# Patient Record
Sex: Female | Born: 1935 | Race: White | Hispanic: No | Marital: Married
Health system: Southern US, Community
[De-identification: ages and names within clinical notes are randomized; demographics above are authoritative.]

---

## 2000-03-05 ENCOUNTER — Encounter: Admission: RE | Admit: 2000-03-05 | Discharge: 2000-03-05 | Payer: Self-pay | Admitting: Orthopedic Surgery

## 2000-03-05 ENCOUNTER — Ambulatory Visit (HOSPITAL_BASED_OUTPATIENT_CLINIC_OR_DEPARTMENT_OTHER): Admission: RE | Admit: 2000-03-05 | Discharge: 2000-03-05 | Payer: Self-pay | Admitting: Orthopedic Surgery

## 2000-03-05 ENCOUNTER — Encounter: Payer: Self-pay | Admitting: Orthopedic Surgery

## 2004-03-02 ENCOUNTER — Other Ambulatory Visit: Payer: Self-pay

## 2004-11-14 ENCOUNTER — Ambulatory Visit (HOSPITAL_COMMUNITY): Admission: RE | Admit: 2004-11-14 | Discharge: 2004-11-14 | Payer: Self-pay | Admitting: Internal Medicine

## 2005-10-18 ENCOUNTER — Emergency Department: Payer: Self-pay | Admitting: Emergency Medicine

## 2005-10-18 ENCOUNTER — Other Ambulatory Visit: Payer: Self-pay

## 2008-11-13 ENCOUNTER — Ambulatory Visit: Payer: Self-pay | Admitting: Emergency Medicine

## 2008-12-02 ENCOUNTER — Ambulatory Visit: Payer: Self-pay | Admitting: Orthopedic Surgery

## 2009-02-22 ENCOUNTER — Ambulatory Visit: Payer: Self-pay | Admitting: Orthopedic Surgery

## 2009-04-14 ENCOUNTER — Emergency Department: Payer: Self-pay | Admitting: Emergency Medicine

## 2009-09-21 ENCOUNTER — Inpatient Hospital Stay: Payer: Self-pay | Admitting: General Practice

## 2009-10-03 ENCOUNTER — Inpatient Hospital Stay: Payer: Self-pay | Admitting: Specialist

## 2009-10-05 ENCOUNTER — Encounter: Payer: Self-pay | Admitting: Internal Medicine

## 2009-10-11 ENCOUNTER — Encounter: Payer: Self-pay | Admitting: Internal Medicine

## 2010-05-10 ENCOUNTER — Ambulatory Visit: Payer: Self-pay | Admitting: Family Medicine

## 2010-06-07 ENCOUNTER — Ambulatory Visit: Payer: Self-pay | Admitting: Family Medicine

## 2012-03-09 LAB — URINALYSIS, COMPLETE
Glucose,UR: NEGATIVE mg/dL (ref 0–75)
Nitrite: NEGATIVE
Protein: 100
RBC,UR: 277 /HPF (ref 0–5)
Specific Gravity: 1.02 (ref 1.003–1.030)

## 2012-03-09 LAB — CBC
HCT: 32.8 % — ABNORMAL LOW (ref 35.0–47.0)
MCHC: 32.1 g/dL (ref 32.0–36.0)
MCV: 85 fL (ref 80–100)
Platelet: 224 10*3/uL (ref 150–440)
RDW: 16.6 % — ABNORMAL HIGH (ref 11.5–14.5)
WBC: 3.7 10*3/uL (ref 3.6–11.0)

## 2012-03-09 LAB — COMPREHENSIVE METABOLIC PANEL
Albumin: 2.6 g/dL — ABNORMAL LOW (ref 3.4–5.0)
Anion Gap: 7 (ref 7–16)
Bilirubin,Total: 0.3 mg/dL (ref 0.2–1.0)
Co2: 28 mmol/L (ref 21–32)
Creatinine: 1.24 mg/dL (ref 0.60–1.30)
EGFR (African American): 46 — ABNORMAL LOW
EGFR (Non-African Amer.): 40 — ABNORMAL LOW
Potassium: 4.9 mmol/L (ref 3.5–5.1)
Sodium: 137 mmol/L (ref 136–145)
Total Protein: 7.3 g/dL (ref 6.4–8.2)

## 2012-03-09 LAB — TROPONIN I: Troponin-I: <0.02 ng/mL

## 2012-03-10 ENCOUNTER — Ambulatory Visit: Payer: Self-pay | Admitting: Internal Medicine

## 2012-03-10 ENCOUNTER — Inpatient Hospital Stay: Payer: Self-pay | Admitting: Internal Medicine

## 2012-03-10 LAB — IRON AND TIBC
Iron Bind.Cap.(Total): 206 ug/dL — ABNORMAL LOW (ref 250–450)
Iron Saturation: 30 %
Iron: 62 ug/dL (ref 50–170)
Unbound Iron-Bind.Cap.: 144 ug/dL

## 2012-03-10 LAB — FERRITIN: Ferritin (ARMC): 87 ng/mL (ref 8–388)

## 2012-03-11 LAB — BASIC METABOLIC PANEL
Anion Gap: 8 (ref 7–16)
BUN: 19 mg/dL — ABNORMAL HIGH (ref 7–18)
Calcium, Total: 7 mg/dL — CL (ref 8.5–10.1)
Chloride: 107 mmol/L (ref 98–107)
Co2: 23 mmol/L (ref 21–32)
Creatinine: 1.04 mg/dL (ref 0.60–1.30)
EGFR (African American): 57 — ABNORMAL LOW
EGFR (Non-African Amer.): 49 — ABNORMAL LOW
Glucose: 75 mg/dL (ref 65–99)
Osmolality: 277 (ref 275–301)
Potassium: 4.2 mmol/L (ref 3.5–5.1)
Sodium: 138 mmol/L (ref 136–145)

## 2012-03-11 LAB — CBC WITH DIFFERENTIAL/PLATELET
Basophil #: 0 10*3/uL (ref 0.0–0.1)
Basophil %: 0.4 %
Eosinophil #: 0 10*3/uL (ref 0.0–0.7)
Eosinophil %: 0.9 %
HCT: 21.4 % — ABNORMAL LOW (ref 35.0–47.0)
HGB: 7.1 g/dL — ABNORMAL LOW (ref 12.0–16.0)
Lymphocyte #: 1.1 10*3/uL (ref 1.0–3.6)
Lymphocyte %: 47.7 %
MCH: 28.1 pg (ref 26.0–34.0)
MCHC: 33 g/dL (ref 32.0–36.0)
MCV: 85 fL (ref 80–100)
Monocyte #: 0.4 x10 3/mm (ref 0.2–0.9)
Monocyte %: 16.6 %
Neutrophil #: 0.8 10*3/uL — ABNORMAL LOW (ref 1.4–6.5)
Neutrophil %: 34.4 %
Platelet: 163 10*3/uL (ref 150–440)
RBC: 2.52 10*6/uL — ABNORMAL LOW (ref 3.80–5.20)
RDW: 16.7 % — ABNORMAL HIGH (ref 11.5–14.5)
WBC: 2.3 10*3/uL — ABNORMAL LOW (ref 3.6–11.0)

## 2012-03-11 LAB — PROTIME-INR
INR: 1.6
Prothrombin Time: 19.7 secs — ABNORMAL HIGH (ref 11.5–14.7)

## 2012-03-11 LAB — URINE CULTURE

## 2012-03-11 LAB — HEMOGLOBIN A1C: Hemoglobin A1C: 5.3 % (ref 4.2–6.3)

## 2012-03-11 LAB — TSH: Thyroid Stimulating Horm: 1.34 u[IU]/mL

## 2012-03-12 LAB — CBC WITH DIFFERENTIAL/PLATELET
Basophil %: 0.7 %
Eosinophil #: 0 10*3/uL (ref 0.0–0.7)
HCT: 22.2 % — ABNORMAL LOW (ref 35.0–47.0)
HGB: 7.3 g/dL — ABNORMAL LOW (ref 12.0–16.0)
Lymphocyte #: 0.9 10*3/uL — ABNORMAL LOW (ref 1.0–3.6)
Lymphocyte %: 47.1 %
MCH: 27.8 pg (ref 26.0–34.0)
MCHC: 32.6 g/dL (ref 32.0–36.0)
MCV: 85 fL (ref 80–100)
Monocyte #: 0.4 x10 3/mm (ref 0.2–0.9)
Monocyte %: 18.9 %
Neutrophil #: 0.6 10*3/uL — ABNORMAL LOW (ref 1.4–6.5)
Platelet: 161 10*3/uL (ref 150–440)

## 2012-03-12 LAB — PROTIME-INR: Prothrombin Time: 18.9 secs — ABNORMAL HIGH (ref 11.5–14.7)

## 2012-03-12 LAB — FOLATE: Folic Acid: 7.7 ng/mL (ref 3.1–100.0)

## 2012-03-13 LAB — CBC WITH DIFFERENTIAL/PLATELET
Basophil #: 0 10*3/uL (ref 0.0–0.1)
Basophil %: 0.5 %
Basophil %: 0.2 %
Eosinophil #: 0 10*3/uL (ref 0.0–0.7)
Eosinophil #: 0 10*3/uL (ref 0.0–0.7)
Eosinophil %: 2 %
Eosinophil %: 1.1 %
HCT: 23.9 % — ABNORMAL LOW (ref 35.0–47.0)
HCT: 25.2 % — ABNORMAL LOW (ref 35.0–47.0)
HGB: 7.9 g/dL — ABNORMAL LOW (ref 12.0–16.0)
HGB: 8 g/dL — ABNORMAL LOW (ref 12.0–16.0)
Lymphocyte #: 1.1 10*3/uL (ref 1.0–3.6)
Lymphocyte #: 1.2 10*3/uL (ref 1.0–3.6)
Lymphocyte %: 56.8 %
MCH: 28.1 pg (ref 26.0–34.0)
MCHC: 31.8 g/dL — ABNORMAL LOW (ref 32.0–36.0)
MCV: 85 fL (ref 80–100)
MCV: 86 fL (ref 80–100)
Monocyte #: 0.4 x10 3/mm (ref 0.2–0.9)
Monocyte %: 17.3 %
Monocyte %: 16.4 %
Neutrophil #: 0.5 10*3/uL — ABNORMAL LOW (ref 1.4–6.5)
Neutrophil #: 0.6 10*3/uL — ABNORMAL LOW (ref 1.4–6.5)
Platelet: 172 10*3/uL (ref 150–440)
RBC: 2.81 10*6/uL — ABNORMAL LOW (ref 3.80–5.20)
RBC: 2.92 10*6/uL — ABNORMAL LOW (ref 3.80–5.20)
RDW: 16.8 % — ABNORMAL HIGH (ref 11.5–14.5)
WBC: 2 10*3/uL — CL (ref 3.6–11.0)

## 2012-03-13 LAB — PROTIME-INR: INR: 1.6

## 2012-03-13 LAB — LACTATE DEHYDROGENASE: LDH: 144 U/L (ref 84–246)

## 2012-03-14 LAB — CBC WITH DIFFERENTIAL/PLATELET
Basophil #: 0 10*3/uL (ref 0.0–0.1)
Basophil %: 0.6 %
Eosinophil #: 0 10*3/uL (ref 0.0–0.7)
Eosinophil %: 1.8 %
HCT: 24.3 % — ABNORMAL LOW (ref 35.0–47.0)
Lymphocyte %: 58.4 %
MCH: 28 pg (ref 26.0–34.0)
MCHC: 33.2 g/dL (ref 32.0–36.0)
MCV: 84 fL (ref 80–100)
Monocyte #: 0.3 x10 3/mm (ref 0.2–0.9)
Neutrophil %: 24.7 %
Platelet: 178 10*3/uL (ref 150–440)
RBC: 2.88 10*6/uL — ABNORMAL LOW (ref 3.80–5.20)
RDW: 17.2 % — ABNORMAL HIGH (ref 11.5–14.5)

## 2012-03-14 LAB — URINALYSIS, COMPLETE
Bacteria: NONE SEEN
Bilirubin,UR: NEGATIVE
Hyaline Cast: 1
Ketone: NEGATIVE
Nitrite: NEGATIVE
Ph: 5 (ref 4.5–8.0)
RBC,UR: 32 /HPF (ref 0–5)
Squamous Epithelial: 1

## 2012-03-14 LAB — PROTIME-INR
INR: 2.2
Prothrombin Time: 25 secs — ABNORMAL HIGH (ref 11.5–14.7)

## 2012-03-14 LAB — SEDIMENTATION RATE: Erythrocyte Sed Rate: 31 mm/hr — ABNORMAL HIGH (ref 0–30)

## 2012-03-15 LAB — PROTIME-INR
INR: 2.8
Prothrombin Time: 29.8 secs — ABNORMAL HIGH (ref 11.5–14.7)

## 2012-04-08 ENCOUNTER — Ambulatory Visit: Payer: Self-pay | Admitting: General Practice

## 2012-04-08 LAB — URINALYSIS, COMPLETE
Bacteria: NONE SEEN
Bilirubin,UR: NEGATIVE
Glucose,UR: NEGATIVE mg/dL (ref 0–75)
Nitrite: NEGATIVE
RBC,UR: 58 /HPF (ref 0–5)
Squamous Epithelial: 3

## 2012-04-08 LAB — SEDIMENTATION RATE: Erythrocyte Sed Rate: 34 mm/hr — ABNORMAL HIGH (ref 0–30)

## 2012-04-08 LAB — PROTIME-INR
INR: 2.3
Prothrombin Time: 25.1 secs — ABNORMAL HIGH (ref 11.5–14.7)

## 2012-04-08 LAB — BASIC METABOLIC PANEL
Anion Gap: 7 (ref 7–16)
Co2: 30 mmol/L (ref 21–32)
Creatinine: 1.03 mg/dL (ref 0.60–1.30)
EGFR (African American): 58 — ABNORMAL LOW
EGFR (Non-African Amer.): 50 — ABNORMAL LOW
Glucose: 157 mg/dL — ABNORMAL HIGH (ref 65–99)
Osmolality: 287 (ref 275–301)
Sodium: 141 mmol/L (ref 136–145)

## 2012-04-08 LAB — CBC
HCT: 32.5 % — ABNORMAL LOW (ref 35.0–47.0)
MCH: 28.1 pg (ref 26.0–34.0)
MCHC: 33.1 g/dL (ref 32.0–36.0)
RDW: 17.1 % — ABNORMAL HIGH (ref 11.5–14.5)
WBC: 4.9 10*3/uL (ref 3.6–11.0)

## 2012-04-10 ENCOUNTER — Ambulatory Visit: Payer: Self-pay | Admitting: Internal Medicine

## 2012-04-23 ENCOUNTER — Inpatient Hospital Stay: Payer: Self-pay | Admitting: General Practice

## 2012-04-24 LAB — PROTIME-INR
INR: 1.1
Prothrombin Time: 14.2 secs (ref 11.5–14.7)

## 2012-04-24 LAB — BASIC METABOLIC PANEL
BUN: 11 mg/dL (ref 7–18)
Calcium, Total: 7.9 mg/dL — ABNORMAL LOW (ref 8.5–10.1)
Chloride: 104 mmol/L (ref 98–107)
Creatinine: 0.91 mg/dL (ref 0.60–1.30)
EGFR (Non-African Amer.): 58 — ABNORMAL LOW
Glucose: 74 mg/dL (ref 65–99)
Osmolality: 277 (ref 275–301)
Potassium: 4.1 mmol/L (ref 3.5–5.1)
Sodium: 140 mmol/L (ref 136–145)

## 2012-04-24 LAB — PLATELET COUNT: Platelet: 99 10*3/uL — ABNORMAL LOW (ref 150–440)

## 2012-04-24 LAB — HEMOGLOBIN: HGB: 8.5 g/dL — ABNORMAL LOW (ref 12.0–16.0)

## 2012-04-25 LAB — BASIC METABOLIC PANEL
BUN: 13 mg/dL (ref 7–18)
Calcium, Total: 7.7 mg/dL — ABNORMAL LOW (ref 8.5–10.1)
Chloride: 107 mmol/L (ref 98–107)
Creatinine: 0.99 mg/dL (ref 0.60–1.30)
Glucose: 142 mg/dL — ABNORMAL HIGH (ref 65–99)
Osmolality: 282 (ref 275–301)
Potassium: 4 mmol/L (ref 3.5–5.1)
Sodium: 140 mmol/L (ref 136–145)

## 2012-04-25 LAB — PLATELET COUNT: Platelet: 106 10*3/uL — ABNORMAL LOW (ref 150–440)

## 2012-04-26 LAB — PROTIME-INR
INR: 2.1
Prothrombin Time: 24.2 secs — ABNORMAL HIGH (ref 11.5–14.7)

## 2012-04-26 LAB — HEMOGLOBIN: HGB: 8.3 g/dL — ABNORMAL LOW (ref 12.0–16.0)

## 2012-06-01 ENCOUNTER — Inpatient Hospital Stay: Payer: Self-pay | Admitting: Internal Medicine

## 2012-06-01 LAB — CK TOTAL AND CKMB (NOT AT ARMC)
CK, Total: 13 U/L — ABNORMAL LOW (ref 21–215)
CK-MB: <0.5 ng/mL — ABNORMAL LOW (ref 0.5–3.6)

## 2012-06-01 LAB — URINALYSIS, COMPLETE
Bilirubin,UR: NEGATIVE
Leukocyte Esterase: NEGATIVE
Nitrite: NEGATIVE
Ph: 5 (ref 4.5–8.0)
Protein: 30
RBC,UR: 75 /HPF (ref 0–5)
Squamous Epithelial: NONE SEEN
WBC UR: 2 /HPF (ref 0–5)

## 2012-06-01 LAB — DIFFERENTIAL
Basophil #: 0 10*3/uL (ref 0.0–0.1)
Basophil %: 0.4 %
Eosinophil %: 0.8 %
Lymphocyte #: 1 10*3/uL (ref 1.0–3.6)
Monocyte #: 0.5 x10 3/mm (ref 0.2–0.9)
Monocyte %: 22.1 %
Neutrophil #: 0.9 10*3/uL — ABNORMAL LOW (ref 1.4–6.5)

## 2012-06-01 LAB — COMPREHENSIVE METABOLIC PANEL
Albumin: 2.2 g/dL — ABNORMAL LOW (ref 3.4–5.0)
Anion Gap: 9 (ref 7–16)
BUN: 18 mg/dL (ref 7–18)
Bilirubin,Total: 0.6 mg/dL (ref 0.2–1.0)
Calcium, Total: 8.2 mg/dL — ABNORMAL LOW (ref 8.5–10.1)
Co2: 29 mmol/L (ref 21–32)
Creatinine: 0.92 mg/dL (ref 0.60–1.30)
Glucose: 138 mg/dL — ABNORMAL HIGH (ref 65–99)
Osmolality: 274 (ref 275–301)
Potassium: 4.3 mmol/L (ref 3.5–5.1)
Sodium: 135 mmol/L — ABNORMAL LOW (ref 136–145)

## 2012-06-01 LAB — TROPONIN I: Troponin-I: <0.02 ng/mL

## 2012-06-01 LAB — CBC
HCT: 28.8 % — ABNORMAL LOW (ref 35.0–47.0)
MCV: 85 fL (ref 80–100)
WBC: 2.4 10*3/uL — ABNORMAL LOW (ref 3.6–11.0)

## 2012-06-02 LAB — SEDIMENTATION RATE: Erythrocyte Sed Rate: 1 mm/hr (ref 0–30)

## 2012-06-02 LAB — CBC WITH DIFFERENTIAL/PLATELET
Basophil %: 0.6 %
Eosinophil %: 0.7 %
HCT: 26.9 % — ABNORMAL LOW (ref 35.0–47.0)
HGB: 9 g/dL — ABNORMAL LOW (ref 12.0–16.0)
Lymphocyte %: 29.2 %
MCHC: 33.4 g/dL (ref 32.0–36.0)
Monocyte %: 14.4 %
Neutrophil #: 1.5 10*3/uL (ref 1.4–6.5)
Neutrophil %: 55.1 %
RBC: 3.13 10*6/uL — ABNORMAL LOW (ref 3.80–5.20)
WBC: 2.8 10*3/uL — ABNORMAL LOW (ref 3.6–11.0)

## 2012-06-02 LAB — COMPREHENSIVE METABOLIC PANEL
Albumin: 1.9 g/dL — ABNORMAL LOW (ref 3.4–5.0)
Anion Gap: 8 (ref 7–16)
BUN: 12 mg/dL (ref 7–18)
Bilirubin,Total: 0.3 mg/dL (ref 0.2–1.0)
Chloride: 100 mmol/L (ref 98–107)
Co2: 27 mmol/L (ref 21–32)
EGFR (African American): >60
EGFR (Non-African Amer.): >60
Glucose: 142 mg/dL — ABNORMAL HIGH (ref 65–99)
SGOT(AST): 18 U/L (ref 15–37)
SGPT (ALT): 26 U/L (ref 12–78)
Sodium: 135 mmol/L — ABNORMAL LOW (ref 136–145)
Total Protein: 6 g/dL — ABNORMAL LOW (ref 6.4–8.2)

## 2012-06-02 LAB — URINE CULTURE

## 2012-06-02 LAB — PROTIME-INR: INR: 1.7

## 2012-06-06 LAB — CULTURE, BLOOD (SINGLE)

## 2012-06-11 ENCOUNTER — Inpatient Hospital Stay: Payer: Self-pay | Admitting: Internal Medicine

## 2012-06-11 LAB — CBC
HCT: 28.5 % — ABNORMAL LOW (ref 35.0–47.0)
HGB: 9.7 g/dL — ABNORMAL LOW (ref 12.0–16.0)
MCH: 28.6 pg (ref 26.0–34.0)
MCHC: 34 g/dL (ref 32.0–36.0)
MCV: 84 fL (ref 80–100)
Platelet: 176 10*3/uL (ref 150–440)
RBC: 3.39 10*6/uL — ABNORMAL LOW (ref 3.80–5.20)
RDW: 16.4 % — ABNORMAL HIGH (ref 11.5–14.5)

## 2012-06-11 LAB — COMPREHENSIVE METABOLIC PANEL
Alkaline Phosphatase: 91 U/L (ref 50–136)
Anion Gap: 4 — ABNORMAL LOW (ref 7–16)
BUN: 21 mg/dL — ABNORMAL HIGH (ref 7–18)
Bilirubin,Total: 0.3 mg/dL (ref 0.2–1.0)
Chloride: 100 mmol/L (ref 98–107)
Creatinine: 1.12 mg/dL (ref 0.60–1.30)
EGFR (African American): 52 — ABNORMAL LOW
SGPT (ALT): 11 U/L — ABNORMAL LOW (ref 12–78)
Sodium: 133 mmol/L — ABNORMAL LOW (ref 136–145)
Total Protein: 6.1 g/dL — ABNORMAL LOW (ref 6.4–8.2)

## 2012-06-11 LAB — URINALYSIS, COMPLETE
Bilirubin,UR: NEGATIVE
Ketone: NEGATIVE
Ph: 7 (ref 4.5–8.0)
Protein: NEGATIVE
RBC,UR: 21 /HPF (ref 0–5)
Specific Gravity: 1.012 (ref 1.003–1.030)
Squamous Epithelial: NONE SEEN

## 2012-06-11 LAB — PROTIME-INR
INR: 1.7
Prothrombin Time: 20 secs — ABNORMAL HIGH (ref 11.5–14.7)

## 2012-06-12 LAB — CBC WITH DIFFERENTIAL/PLATELET
Basophil #: 0 10*3/uL (ref 0.0–0.1)
Eosinophil #: 0 10*3/uL (ref 0.0–0.7)
Eosinophil %: 0.5 %
HCT: 26.6 % — ABNORMAL LOW (ref 35.0–47.0)
Lymphocyte #: 0.6 10*3/uL — ABNORMAL LOW (ref 1.0–3.6)
Lymphocyte %: 16.2 %
MCH: 28.2 pg (ref 26.0–34.0)
MCV: 84 fL (ref 80–100)
Monocyte %: 16.9 %
Neutrophil #: 2.3 10*3/uL (ref 1.4–6.5)
Platelet: 137 10*3/uL — ABNORMAL LOW (ref 150–440)
RBC: 3.15 10*6/uL — ABNORMAL LOW (ref 3.80–5.20)
RDW: 16.4 % — ABNORMAL HIGH (ref 11.5–14.5)
WBC: 3.5 10*3/uL — ABNORMAL LOW (ref 3.6–11.0)

## 2012-06-12 LAB — MAGNESIUM: Magnesium: 1.9 mg/dL

## 2012-06-12 LAB — BASIC METABOLIC PANEL
BUN: 16 mg/dL (ref 7–18)
Calcium, Total: 7.9 mg/dL — ABNORMAL LOW (ref 8.5–10.1)
Chloride: 102 mmol/L (ref 98–107)
Creatinine: 1.06 mg/dL (ref 0.60–1.30)
EGFR (Non-African Amer.): 48 — ABNORMAL LOW
Glucose: 118 mg/dL — ABNORMAL HIGH (ref 65–99)
Potassium: 5.1 mmol/L (ref 3.5–5.1)

## 2012-06-13 LAB — CBC WITH DIFFERENTIAL/PLATELET
Basophil %: 0.5 %
Eosinophil %: 0.9 %
HCT: 24.4 % — ABNORMAL LOW (ref 35.0–47.0)
HGB: 8.4 g/dL — ABNORMAL LOW (ref 12.0–16.0)
Lymphocyte #: 0.7 10*3/uL — ABNORMAL LOW (ref 1.0–3.6)
MCH: 29 pg (ref 26.0–34.0)
MCV: 84 fL (ref 80–100)
Monocyte #: 0.5 x10 3/mm (ref 0.2–0.9)
Monocyte %: 16.1 %
Platelet: 159 10*3/uL (ref 150–440)
RBC: 2.9 10*6/uL — ABNORMAL LOW (ref 3.80–5.20)
WBC: 3.3 10*3/uL — ABNORMAL LOW (ref 3.6–11.0)

## 2012-06-13 LAB — PROTIME-INR
INR: 1.8
Prothrombin Time: 20.9 secs — ABNORMAL HIGH (ref 11.5–14.7)

## 2012-06-13 LAB — URINE CULTURE

## 2012-06-14 LAB — CBC WITH DIFFERENTIAL/PLATELET
Basophil %: 0.8 %
Eosinophil #: 0.1 10*3/uL (ref 0.0–0.7)
Eosinophil %: 1.4 %
HCT: 23 % — ABNORMAL LOW (ref 35.0–47.0)
HGB: 8.1 g/dL — ABNORMAL LOW (ref 12.0–16.0)
Lymphocyte %: 19.5 %
Monocyte %: 11.4 %
Neutrophil #: 2.5 10*3/uL (ref 1.4–6.5)
Neutrophil %: 66.9 %
RBC: 2.76 10*6/uL — ABNORMAL LOW (ref 3.80–5.20)
WBC: 3.7 10*3/uL (ref 3.6–11.0)

## 2012-06-15 LAB — CREATININE, SERUM
EGFR (African American): >60
EGFR (Non-African Amer.): 58 — ABNORMAL LOW

## 2012-06-15 LAB — VANCOMYCIN, TROUGH: Vancomycin, Trough: 9 ug/mL — ABNORMAL LOW (ref 10–20)

## 2012-06-15 LAB — PROTIME-INR: INR: 2.8

## 2012-06-16 LAB — PROTIME-INR: INR: 3.4

## 2012-06-16 LAB — CULTURE, BLOOD (SINGLE)

## 2012-10-03 ENCOUNTER — Ambulatory Visit: Payer: Self-pay | Admitting: Family Medicine

## 2012-10-03 LAB — PROTIME-INR: Prothrombin Time: 44.7 secs — ABNORMAL HIGH (ref 11.5–14.7)

## 2012-12-02 ENCOUNTER — Ambulatory Visit: Payer: Self-pay | Admitting: General Practice

## 2012-12-02 LAB — BASIC METABOLIC PANEL
Anion Gap: 2 — ABNORMAL LOW (ref 7–16)
BUN: 21 mg/dL — ABNORMAL HIGH (ref 7–18)
Co2: 31 mmol/L (ref 21–32)
Creatinine: 1.2 mg/dL (ref 0.60–1.30)
EGFR (African American): 48 — ABNORMAL LOW
EGFR (Non-African Amer.): 41 — ABNORMAL LOW
Osmolality: 276 (ref 275–301)
Sodium: 135 mmol/L — ABNORMAL LOW (ref 136–145)

## 2012-12-02 LAB — URINALYSIS, COMPLETE
Ketone: NEGATIVE
Nitrite: POSITIVE
Ph: 7 (ref 4.5–8.0)
Protein: 30
RBC,UR: 3 /HPF (ref 0–5)
Specific Gravity: 1.016 (ref 1.003–1.030)
Squamous Epithelial: 2
WBC UR: 122 /HPF (ref 0–5)

## 2012-12-02 LAB — CBC
HGB: 11.2 g/dL — ABNORMAL LOW (ref 12.0–16.0)
MCH: 27.9 pg (ref 26.0–34.0)
MCV: 86 fL (ref 80–100)
Platelet: 162 10*3/uL (ref 150–440)
RDW: 14.6 % — ABNORMAL HIGH (ref 11.5–14.5)
WBC: 4.2 10*3/uL (ref 3.6–11.0)

## 2012-12-02 LAB — APTT: Activated PTT: 30.1 secs (ref 23.6–35.9)

## 2012-12-02 LAB — PROTIME-INR: INR: 1.6

## 2012-12-02 LAB — MRSA PCR SCREENING

## 2012-12-03 LAB — URINE CULTURE

## 2012-12-15 ENCOUNTER — Inpatient Hospital Stay: Payer: Self-pay | Admitting: General Practice

## 2012-12-15 LAB — PROTIME-INR
INR: 1.1
Prothrombin Time: 14.4 secs (ref 11.5–14.7)

## 2012-12-16 LAB — BASIC METABOLIC PANEL
Co2: 29 mmol/L (ref 21–32)
Creatinine: 1.06 mg/dL (ref 0.60–1.30)
EGFR (African American): 55 — ABNORMAL LOW
Glucose: 118 mg/dL — ABNORMAL HIGH (ref 65–99)
Osmolality: 271 (ref 275–301)
Potassium: 4.6 mmol/L (ref 3.5–5.1)
Sodium: 135 mmol/L — ABNORMAL LOW (ref 136–145)

## 2012-12-16 LAB — PROTIME-INR: Prothrombin Time: 14.5 secs (ref 11.5–14.7)

## 2012-12-16 LAB — PLATELET COUNT: Platelet: 129 10*3/uL — ABNORMAL LOW (ref 150–440)

## 2012-12-17 LAB — PLATELET COUNT: Platelet: 125 10*3/uL — ABNORMAL LOW (ref 150–440)

## 2012-12-17 LAB — BASIC METABOLIC PANEL
BUN: 12 mg/dL (ref 7–18)
Calcium, Total: 7.4 mg/dL — ABNORMAL LOW (ref 8.5–10.1)
Chloride: 104 mmol/L (ref 98–107)
Creatinine: 0.99 mg/dL (ref 0.60–1.30)
EGFR (African American): >60
EGFR (Non-African Amer.): 52 — ABNORMAL LOW
Glucose: 117 mg/dL — ABNORMAL HIGH (ref 65–99)
Potassium: 4.3 mmol/L (ref 3.5–5.1)

## 2012-12-17 LAB — PROTIME-INR
INR: 2.5
Prothrombin Time: 26.6 secs — ABNORMAL HIGH (ref 11.5–14.7)

## 2012-12-17 LAB — HEMOGLOBIN: HGB: 7.1 g/dL — ABNORMAL LOW (ref 12.0–16.0)

## 2012-12-18 LAB — PROTIME-INR
INR: 3.3
Prothrombin Time: 32.3 secs — ABNORMAL HIGH (ref 11.5–14.7)

## 2013-01-02 ENCOUNTER — Emergency Department: Payer: Self-pay | Admitting: Emergency Medicine

## 2013-10-25 ENCOUNTER — Emergency Department: Payer: Self-pay | Admitting: Emergency Medicine

## 2013-10-26 LAB — CBC WITH DIFFERENTIAL/PLATELET
BASOS ABS: 0 10*3/uL (ref 0.0–0.1)
Basophil %: 0.8 %
Eosinophil #: 0 10*3/uL (ref 0.0–0.7)
Eosinophil %: 1.5 %
HCT: 33 % — ABNORMAL LOW (ref 35.0–47.0)
HGB: 11.1 g/dL — AB (ref 12.0–16.0)
Lymphocyte #: 1.6 10*3/uL (ref 1.0–3.6)
Lymphocyte %: 67.2 %
MCH: 28.7 pg (ref 26.0–34.0)
MCHC: 33.7 g/dL (ref 32.0–36.0)
MCV: 85 fL (ref 80–100)
Monocyte #: 0.4 x10 3/mm (ref 0.2–0.9)
Monocyte %: 15 %
NEUTROS PCT: 15.5 %
Neutrophil #: 0.4 10*3/uL — ABNORMAL LOW (ref 1.4–6.5)
PLATELETS: 111 10*3/uL — AB (ref 150–440)
RBC: 3.88 10*6/uL (ref 3.80–5.20)
RDW: 15.5 % — ABNORMAL HIGH (ref 11.5–14.5)
WBC: 2.4 10*3/uL — ABNORMAL LOW (ref 3.6–11.0)

## 2013-10-26 LAB — COMPREHENSIVE METABOLIC PANEL
ALK PHOS: 75 U/L
ALT: 8 U/L — AB (ref 12–78)
Albumin: 2.6 g/dL — ABNORMAL LOW (ref 3.4–5.0)
Anion Gap: 3 — ABNORMAL LOW (ref 7–16)
BUN: 18 mg/dL (ref 7–18)
Bilirubin,Total: 0.3 mg/dL (ref 0.2–1.0)
CHLORIDE: 104 mmol/L (ref 98–107)
CREATININE: 1.1 mg/dL (ref 0.60–1.30)
Calcium, Total: 8 mg/dL — ABNORMAL LOW (ref 8.5–10.1)
Co2: 29 mmol/L (ref 21–32)
EGFR (African American): 53 — ABNORMAL LOW
GFR CALC NON AF AMER: 46 — AB
GLUCOSE: 107 mg/dL — AB (ref 65–99)
Osmolality: 274 (ref 275–301)
POTASSIUM: 4.5 mmol/L (ref 3.5–5.1)
SGOT(AST): 16 U/L (ref 15–37)
Sodium: 136 mmol/L (ref 136–145)
TOTAL PROTEIN: 6.6 g/dL (ref 6.4–8.2)

## 2013-10-26 LAB — URINALYSIS, COMPLETE
Bilirubin,UR: NEGATIVE
Glucose,UR: NEGATIVE mg/dL (ref 0–75)
Hyaline Cast: 1
Ketone: NEGATIVE
Leukocyte Esterase: NEGATIVE
NITRITE: NEGATIVE
Ph: 6 (ref 4.5–8.0)
Protein: NEGATIVE
RBC,UR: 101 /HPF (ref 0–5)
SPECIFIC GRAVITY: 1.019 (ref 1.003–1.030)
Squamous Epithelial: <1

## 2013-10-26 LAB — TROPONIN I: Troponin-I: 0.04 ng/mL

## 2013-10-26 LAB — PROTIME-INR
INR: >15.1
PROTHROMBIN TIME: 111.5 s — AB (ref 11.5–14.7)

## 2013-10-26 LAB — TSH: THYROID STIMULATING HORM: 0.651 u[IU]/mL

## 2013-12-09 DEATH — deceased

## 2014-05-19 IMAGING — CR DG ABDOMEN 1V
1 series · 2 of 2 positions shown · non-contrast
Comparison: none

REASON FOR EXAM: abdominal distension
COMMENTS:

[Series 1: t abdomen supine · 0.14mm/px · 2 of 2 slices shown]
[im 1/2]
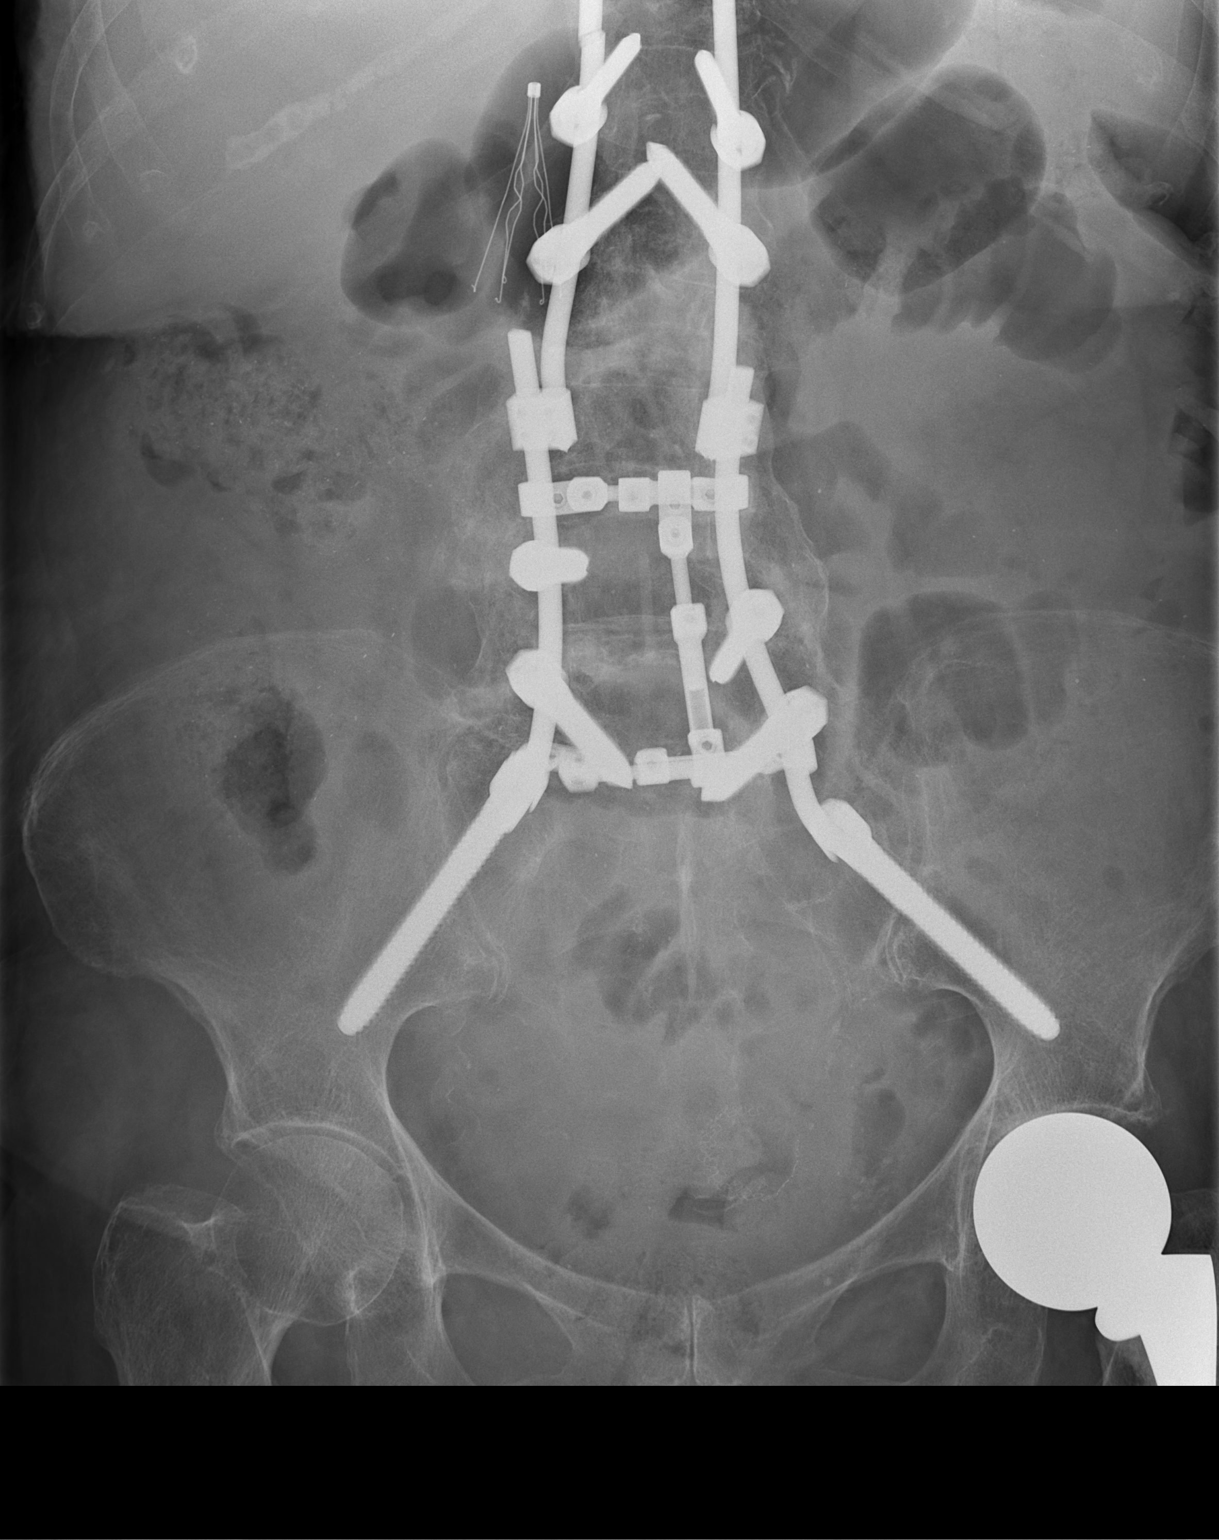
[im 2/2]
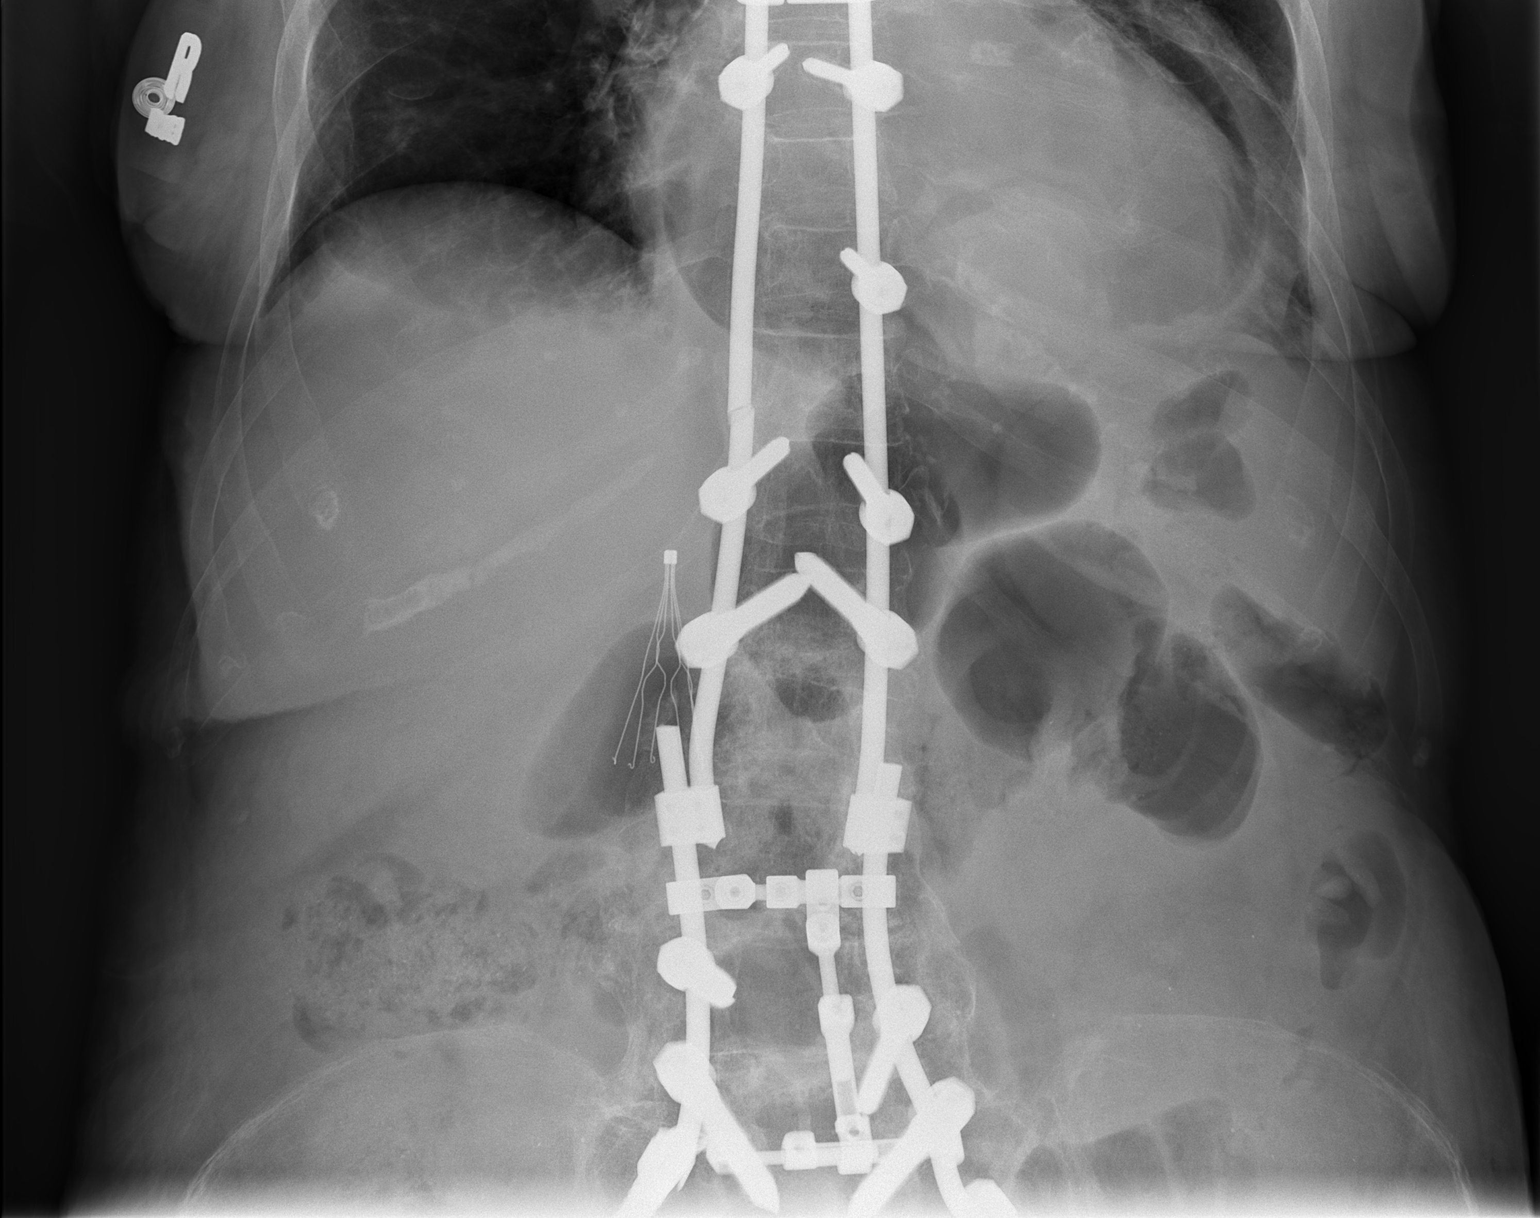

[2 of 2 positions shown; findings below may reference images not displayed]

PROCEDURE:     DXR - DXR KIDNEY URETER BLADDER  - March 12, 2012  [DATE]

RESULT:     The bony structures are osteopenic. The patient has undergone
posterior fusion of the lumbar spine and sacrum. There is an inferior vena
caval filter present. There is a hemiarthroplasty on the left. The bowel gas
pattern is nonspecific. There are a few loops of mildly distended gas-filled
small bowel are present in the mid and upper abdomen. There is a small
amount of gas and stool in the rectum.
IMPRESSION: The bowel gas pattern is nonspecific. There may be an
element of constipation present. There are a few loops of mildly distended
gas filled small bowel.

## 2014-12-28 NOTE — Discharge Summary (Signed)
PATIENT NAME:  Tracey Hopkins, Tracey Hopkins MR#:  [id number] DATE OF BIRTH:  March 23, 1926  DATE OF ADMISSION:  06/11/2012 DATE OF DISCHARGE:  06/16/2012  DISCHARGE DIAGNOSES:  1. Health-care acquired pneumonia.  2. Systemic antiinflammatory response syndrome. 3. Hypoxemia.  4. Chronic back pain status post laminectomy. 5. History of deep venous thrombosis, on Coumadin. 6. Chronic kidney disease. 7. Viral laryngitis status post treatment. 8. History of Clostridium difficile status post treatment. Currently the patient has no diarrhea.  9. Osteoarthritis.  10. Chronic back pain.  11. History of small intestinal cancer status post surgery.   DISPOSITION: The patient is being discharged to a rehab facility. Code status is DO NOT RESUSCITATE.   DIET: Regular.   ACTIVITY: As tolerated.  COUMADIN INSTRUCTIONS: PT and INR checkup on 06/18/2012. Hold Coumadin until PT/INR is checked on 06/18/2012 following which Coumadin can be restarted after informing the MD at the rehab facility.   DISCHARGE MEDICATIONS:  1. Trazodone 150 mg at bedtime.  2. Ferrous sulfate 325 mg once a day.  3. Lyrica 50 mg twice a day. 4. Omeprazole 40 mg once a day. 5. Tylenol 500 mg 2 tablets every four hours p.r.n.  6. Advair 100/50 one puff twice a day. 7. Spiriva 18 mcg inhaled daily.  8. Bisacodyl 10 mg rectal suppository once a day as needed for constipation.  9. Simethicone 125 mg every 6 hours as needed.  10. Zofran 4 mg every 6 hours as needed. 11. Robaxin 750 mg every 6 hours as needed.  12. Multivitamin 1 tablet once a day. 13. Megace 625 mg/5 mL, 5 mL once a day.  14. Oxycodone 5 mg every 3 hours as needed.  15. Tramadol 50 mg 1 to 2 tablets every four hours as needed. 16. Coumadin 1 mg once a day. 17. Combivent 1 puff four times daily. 18. Zyvox 600 mg twice a day for 10 days. 19. Lactobacillus 1 capsule three times daily. 20. Levaquin 500 mg daily for 10 days.  21. Robitussin-DM every 10 mL every 4 to 6 hours  as needed.   OXYGEN: 1 liter via nasal cannula.   RESULTS: INR 3.4.   Chest x-ray: Increased interstitial density predominantly in the right lung. May reflect early interstitial pneumonitis or segmental atelectasis.   Blood cultures: No growth so far.   Urine culture: No growth so far.  Streptococcus pneumoniae antigen negative. Legionella antigen negative. Glucose 120, BUN 21, creatinine 1.12, sodium 133, potassium 5.1, and CO2 29. Normal LFTs. White count 5.4, hemoglobin 9.7 to 8.1, and platelets 137 to 176.   HOSPITAL COURSE: The patient is an 81 year old female with severe arthritis, chronic back pain, and multiple back surgeries who presented with shortness of breath, cough and congestion. She was found to have health-care acquired pneumonia. She was admitted to the hospital and started on empiric antibiotics. Blood cultures were sent which have shown no growth so far. Strep/Legionella antigens were checked and were negative. At the time of discharge, she is being switched to oral antibiotics. She has remained afebrile. She had evidence of SIRS on presentation, which has currently resolved. All her cultures have been negative. She is on 1 liter oxygen for oxygen supplementation. She has a history of deep venous thrombosis and PE and was accidentally taken off Coumadin at the skilled nursing facility. She was restarted on Coumadin during the hospitalization. Her current INR is 2.4. She should have a repeat PT/INR check on 06/18/2012. Coumadin should be held until the INR has been rechecked.  She has a history of C. difficile, which has been treated. She has had no further diarrhea in the hospital and during hospitalization she was on an oral probiotic. She was on multiple stool softeners, which have been discontinued. She is being discharged in a stable condition.   CODE STATUS: DO NOT RESUSCITATE.   TIME SPENT: 45 minutes.  ____________________________ Darrick MeigsSangeeta Ranette Luckadoo,  MD sp:slb D: 06/16/2012 10:33:24 ET T: 06/16/2012 10:48:00 ET JOB#: [id number]  cc: Darrick MeigsSangeeta Nichole Keltner, MD, <Dictator> Darrick MeigsSANGEETA Facundo Allemand MD ELECTRONICALLY SIGNED 06/17/2012 15:56

## 2014-12-28 NOTE — Op Note (Signed)
PATIENT NAME:  Tracey Hopkins, [name] MR#:  [id number] DATE OF BIRTH:  1926/08/05  DATE OF PROCEDURE:  04/23/2012  PREOPERATIVE DIAGNOSIS: Degenerative arthrosis of the right knee.   POSTOPERATIVE DIAGNOSIS: Degenerative arthrosis of the right knee.   PROCEDURE PERFORMED: Right total knee arthroplasty using computer-assisted navigation.   SURGEON: Illene LabradorJames P. Hooten, M.D.   ASSISTANT: Van ClinesJon Wolfe, PA-C (required to maintain retraction throughout the procedure)   ANESTHESIA: Femoral nerve block and general.   ESTIMATED BLOOD LOSS: 100 mL.   FLUIDS REPLACED: 2200 mL of crystalloid.   TOURNIQUET TIME: 81 minutes.   DRAINS: Two medium drains to reinfusion system.   SOFT TISSUE RELEASES: Anterior cruciate ligament, posterior cruciate ligament, deep medial collateral ligament, and patellofemoral ligament.   IMPLANTS UTILIZED: DePuy PFC Sigma size 2.5 posterior stabilized femoral component (cemented), size 2.5 MBT tibial component (cemented), 32 mm three peg oval dome patella (cemented), and a 10 mm stabilized rotating platform polyethylene insert.   INDICATIONS FOR SURGERY: The patient is an [age] year old female who has been seen for complaints of progressive bilateral knee pain with right knee more symptomatic than the left. X-rays of the right knee demonstrated severe degenerative changes with valgus deformity. After discussion of the risks and benefits of surgical intervention, the patient expressed her understanding of the risks and benefits and agreed with plans for surgical intervention.   PROCEDURE IN DETAIL: The patient was brought into the Operating Room and, after adequate femoral nerve block and general anesthesia was achieved, a tourniquet was placed on the patient's upper right thigh. The patient's right knee and leg were cleaned and prepped with alcohol and DuraPrep and draped in the usual sterile fashion. A "time out" was performed as per usual protocol. The right lower extremity was  exsanguinated using an Esmarch, and the tourniquet was inflated to 300 mmHg. An anterior longitudinal incision was made incorporating a previous surgical scar. A mid vastus approach was utilized and a small effusion was evacuated. Significant adhesions were noted to both the medial and lateral gutters as well as along the patella. These were debrided using electrocautery so as to better mobilize the soft tissue. The deep fibers of the medial collateral ligament were elevated in subperiosteal fashion off the medial flare of the tibia so as to maintain a continuous soft tissue sleeve. The patella was subluxed laterally and the patellofemoral ligament was incised. Inspection of the knee demonstrated significant degenerative changes with fibrous tissue along the margins. Prominent osteophytes were debrided using a rongeur. Anterior and posterior cruciate ligaments were excised. Two 4.0 mm Schanz pins were inserted into the femur and into the tibia for attachment of the array of trackers used for computer-assisted navigation. Hip center was identified using a circumduction technique. Distal landmarks were mapped using the computer. The distal femur and proximal tibia were mapped using the computer. Distal femoral cutting guide was positioned using computer-assisted navigation so as to achieve a 5 degree distal valgus cut. Cut was performed and verified using the computer. Distal femur was then sized and it was felt that a size 2.5 femoral component was appropriate. A size 2.5 cutting guide was positioned and the anterior cut was performed and verified using the computer. This was followed by completion of the posterior and chamfer cuts. The femoral cutting guide for the central box was then positioned and the central box cut was performed.   Attention was then directed to the proximal tibia. Remnants of the medial and lateral menisci were excised. The extramedullary tibial cutting  guide was positioned using  computer-assisted navigation so as to achieve 0 degree varus valgus alignment and 0 degree posterior slope. Cut was performed and verified using the computer. The proximal tibia was sized and it was felt that a size 2.5 tibial tray was appropriate. Tibial and femoral trials were inserted followed by insertion of a 10 mm polyethylene insert. This allowed for excellent mediolateral soft tissue balancing both with the knee in full extension and in 90 degrees of flexion. Finally, the patella was cut and prepared so as to accommodate a 32 mm three peg oval dome patella. Patellar trial was placed and the knee was placed through a range of motion with excellent patellar tracking appreciated.   Femoral trial was removed. The central post hole for the tibial component was reamed followed by insertion of a keel punch. Tibial trial was then removed. The cut surfaces of bone were irrigated with copious amounts of normal saline with antibiotic solution using pulsatile lavage and then suctioned dry. It should be noted that the bone was significantly osteopenic and relatively soft. Polymethyl methacrylate cement with gentamicin was prepared in the usual fashion using a vacuum mixer. Cement was applied to the cut surface of the proximal tibia as well as along the undersurface of a size 2.5 MBT tibial component. The tibial component was positioned and impacted into place. Excess cement was removed using freer elevators. Cement was then applied to the cut surface of the femur as well as along the posterior flanges of a size 2.5 posterior stabilized femoral component. Femoral component was positioned and impacted into place. Excess cement was removed using freer elevators. A 10 mm stabilized polyethylene trial was inserted and the knee was brought into full extension with steady axial compression applied. Finally, cement was applied to the backside of a 32 mm three peg oval dome patella and the patellar component was positioned and  patellar clamp applied. Excess cement was removed using freer elevators.   After adequate curing of cement, the tourniquet was deflated after a total tourniquet time of 81 minutes. Hemostasis was achieved using electrocautery. The knee was irrigated with copious amounts of normal saline with antibiotic solution using pulsatile lavage and then suctioned dry. The knee was inspected for any residual cement debris. 30 mL of 0.25% Marcaine with epinephrine was injected along the posterior capsule. A 10 mm stabilized rotating platform polyethylene insert was inserted and the knee was placed through a range of motion. Excellent patellar tracking was appreciated. Excellent mediolateral soft tissue balancing was also noted.   The medial parapatellar portion of the incision was reapproximated using interrupted sutures of #1 Vicryl. The subcutaneous tissue was approximated in layers using first #0 Vicryl followed by #2-0 Vicryl. The skin was closed with skin staples. Sterile dressing was applied.   The patient tolerated the procedure well. She was transported to the recovery room in stable condition. ____________________________ Illene Labrador. Angie Fava., MD jph:slb D: 04/23/2012 21:51:48 ET     T: 04/24/2012 09:26:14 ET        JOB#: [id number] cc: Illene Labrador. Angie Fava., MD, <Dictator> JAMES P Angie Fava MD ELECTRONICALLY SIGNED 04/25/2012 1:03

## 2014-12-28 NOTE — Discharge Summary (Signed)
PATIENT NAME:  Tracey Hopkins, Tracey Hopkins MR#:  [id number] DATE OF BIRTH:  26-Apr-1926  DATE OF ADMISSION:  06/01/2012 DATE OF DISCHARGE:  06/02/2012   Possible transfer to [facility] on 06/02/2012. This is pending.  ADMITTING DIAGNOSIS: Sepsis.    DISCHARGE DIAGNOSES:  1. Systemic inflammatory response reaction due to surgical wound infection in midthoracic area.  2. Hypotension due to systemic inflammatory response reaction, resolved on intravenous fluids. 3. Chronic back pain.  4. Ataxia, balance problems with physical therapy today on day of discharge, 06/02/2012, questionable cerebellar injury.  5. Hyponatremia.  6. Hypomagnesemia.  7. Hyperglycemia.  8. Pancytopenia.  9. Malnutrition with hypoalbuminemia. 10. History of osteoarthritis, recent extensive emergent back surgery for spinal cord compression with residual left lower extremity weakness, bowel as well as bladder incontinence.  11. Osteoarthritis. 12. Deep venous thrombosis and pulmonary embolus in the past, status post IVC filter placement in remote past, on chronic anticoagulation with Coumadin with INR of 1.7 today on 06/02/2012.    DISCHARGE CONDITION: Stable.   DISCHARGE MEDICATIONS: The patient is to continue:  1. Trazodone 150 mg p.o. at bedtime. 2. Iron sulfate 325 mg p.o. daily day. 3. Lyrica 50 mg p.o. twice daily. 4. Omeprazole 40 mg p.o. daily. 5. Oxycodone 5 mg 1 to 2 tablets every four hours as needed.  6. Ultram 50 mg 1 to 2 tablets every four hours as needed.  7. Milk of Magnesia 30 mL twice daily as needed. 8. Mylanta 30 mL every six hours as needed.  9. Multivitamin 1 capsule once daily.  10. Dulcolax suppository rectally daily as needed. 11. Flexeril 5 mg p.o. twice daily. 12. Senokot 50/8.6 mg 1 tablet twice daily. 13. Acetaminophen 500 mg 1 to 2 tablets every four hours as needed, do not exceed 3 grams in a day.  14. MiraLAX oral powder for reconstitution 17 grams daily.  15. Warfarin 4 mg p.o.  daily.  16. Lovenox 40 mg/0.4 mL injectable solution 40 mg injectable once daily.  17. Vancomycin 750 mg IV every 24 hours.  18. Aztreonam 1 gram IV every eight hours.  19. Duke's Mouthwash 15 mL every four hours as needed.   HOME OXYGEN: None.   DIET: Regular, mechanical soft.   ACTIVITY LIMITATIONS: As tolerated.   FOLLOW-UP: Follow-up with Dr. Quillian Quince in two days after discharge.   CONSULTANTS: None.   RADIOLOGIC STUDIES:  1. Chest, portable, single view, 06/01/2012 showed density in the right midlung. Borderline cardiomegaly.  2. CT scan of head without contrast 06/01/2012 showed no acute intracranial abnormality. Findings are consistent with atrophy and microangiopathy. Tiny multiple lateral low attenuation areas in the basal ganglia could represent prominent perivascular spaces and/or lacunar infarcts according to the radiologist.  3. CT of chest for pulmonary embolism with contrast 06/01/2012 revealed no pulmonary embolus evident. Inferior vena cava filter is present. No thoracic aortic aneurysm or thoracic aortic dissection. Large portion of stomach is intrathoracic. Some pleural thickening possibly with a trace pleural effusion and some thickening along the fissures on the right. No infiltrate or discrete mass in the lungs noted.   REASON FOR ADMISSION: The patient is an 81 year old Caucasian female with past medical history significant for history of recent extensive back surgery for spinal compression approximately two weeks ago who presented to the hospital from skilled nursing facility/rehabilitation facility with fevers as high as 101.1 as well as some hypotension. Please refer to Dr. Teena Irani admission note on 06/01/2012. On arrival to the hospital the patient was complaining  of significant back pain since surgery, otherwise, she denies any other problems. Physical exam did not show significant abnormalities on her incision site exam at that time. She had no discharge or oozing  and surgical incision was healing well according to admitting physician.   HOSPITAL COURSE: The patient was admitted to the hospital, started on antibiotics with vancomycin as well as aztreonam and she had x-ray of her chest done. Because her x-ray of the chest revealed possible pneumonia density in the right midlung, it was felt that the patient could have pneumonia which could present with fever. CT scan, however, of her chest failed to show any pneumonia. On better looking at her posterior spinal incision wound, she was noted to have left midthoracic area erythema but no fluctuation or drainage was noted or discharge from the area adjacent to the incision, erythema of approximately 1 inch in diameter. It was felt that the patient does have incision wound infection and that is the cause of the patient's fever. The patient should continue antibiotic therapy for now IV. In regards to her hypotension, which was marginal, the patient was given IV fluids and her hypotension resolved. On discharge the patient's vital signs are stable with temperature of 99, pulse 97, respiration rate 19, blood pressure 149/72, saturation 93% on room air at rest. While trying physical therapy today in the hospital, the patient was noted to have some ataxia balance problems whenever she would sit up in the bed. She had significant left lower extremity weakness, however, she admitted that this weakness is about the same since her surgery.   The patient was noted to be hyponatremic. On day of admission the patient's sodium level was found to be 135. The patient was given IV fluids and her sodium level still remained somewhat low. It was felt to be possibly related to some element of dehydration. The patient was also noted to be hypomagnesemic with magnesium level of 1.7. She is receiving IV magnesium today on 06/02/2012. The patient was noted to be hyperglycemic with glucose levels around 140's. It was felt to be stress related. No  hemoglobin A1c was performed at this time yet.    The patient was noted to be pancytopenic. On admission to the hospital the patient's CBC revealed white cell count of 2.4 with absolute neutrophil count only 0.9 and hemoglobin level of 9.7, platelet count 137. It was felt to be systemic inflammatory response reaction sepsis related, however, with antibiotic administration the patient's pancytopenia remained unchanged. Her white blood cell count was somewhat slightly improved to 2.8 with absolute neutrophil count to 1.5 thousand. Initially because of absolute neutrophil count below 1000, the patient was on neutropenic precautions, however, now since the patient's absolute neutrophil count is 1,500 it was felt that the patient's neutropenic precautions can be lifted. The patient's hemoglobin level remained stable even with hydration. The patient's hemoglobin level drifted down from 9.7 to 9.0 on 06/02/2012. The patient's platelet count, however, remains low at 118 on the day of discharge, 06/02/2012.   The patient was also noted to be somewhat coagulopathic. Initially on arrival to the hospital the patient's pro time was 15.8 and INR was 1.2. It was felt to be due to Coumadin use. The patient's Coumadin dose was advanced after discussion with Ms. Valentina Lucks and the patient's INR on the day of discharge, 06/02/2012, is 1.7 with pro-time level of 20.7. Blood cultures x2 done on 06/01/2012 on day of admission remained negative after 18 to 24 hours of incubation.  Urinalysis showed amber cloudy urine, negative for glucose, bilirubin, or ketones, specific gravity 1.018, pH 5.0, 3+ blood, 30 mg/dL protein, negative for nitrites or leukocyte esterase, 75 red blood cells, 2 white blood cells but no bacteria or epithelial cells were noted as well as mucous was present. It was felt that the patient very unlikely has a urinary tract infection. The patient, however, had symptoms consistent with bowel incontinence as well as  possibly even bladder incontinence, however, she denied that. She stated that her bowel incontinence has been a problem for the past three weeks or so. Supportive therapy was only given for her. The patient had a bowel movement on 06/02/2012 which was unformed, incontinent, dark Denley stool. However, she was not noted to have diarrhea. The patient is to continue her stool softener depending on her need.   In regards to extensive emergent back surgery for spinal cord compression with residual left lower extremity weakness, it was felt that the patient would benefit best if she was transferred to Dr. Layla MawHey's service as he knows her best.   The patient was noted to be hypoalbuminemic. Her albumin level was low already when she came to the Emergency Room on 06/01/2012 of 2.2, however, the patient's albumin level drifted down to 1.9 with hydration. It is recommended to have dietitian support for nutritional [facility]. [facility] Dr. Layla MawHey's service.   TIME SPENT: 40 minutes.  ____________________________ Katharina Caperima Atsushi Yom, MD rv:drc D: 06/02/2012 12:11:37 ET T: 06/02/2012 12:58:59 ET JOB#: [id number] cc: Katharina Caperima Mylez Venable, MD, <Dictator>, L. Clayborn BignessKatherine Bliss, MD, Dr. Dorita FrayLloyd Hey, [facility]  Alamin Mccuiston MD ELECTRONICALLY SIGNED 06/04/2012 18:27

## 2014-12-28 NOTE — H&P (Signed)
PATIENT NAME:  Tracey Hopkins, Tracey Hopkins MR#:  [id number] DATE OF BIRTH:  [birth year]  DATE OF ADMISSION:  06/01/2012  REFERRING PHYSICIAN: Lowella Fairy, MD  PRIMARY CARE PHYSICIAN: Clayborn Bigness, MD  CHIEF COMPLAINT: Fever and hypotension.   HISTORY OF PRESENT ILLNESS: This is an 81 year old female with significant past medical history of chronic back pain, osteoarthritis, history of multiple back surgeries, as well as history of deep venous thrombosis and pulmonary embolus, on chronic anticoagulation. The patient was sent from her nursing home for fever. Upon presentation the patient was found to be hypoxic in the low 80s, required oxygen. As well the patient was hypotensive with systolic blood pressure in the low 80s where she did require IV fluid bolus. As well the patient was febrile with fever of 101.1 in the ED. The patient had recent back surgery done at [facility] two to three weeks ago. The patient currently cannot recall exactly what kind of surgery, but reports she had some vertebral fractures and pinched nerve where she reports she was supposed to follow up next week. As well she had recent right knee replacement surgery at [facility] on 04/23/2012. A chest x-ray did not show any infiltrate, urinalysis did not show any evidence of infection as well, and the patient had blood cultures sent and was started on IV vancomycin and Azactam empirically in the ED until the rest of septic work-up is done. Hospitalist was requested to admit the patient for further management of her hypotension and fever. The patient denies any dysuria, polyuria, cough, productive sputum, worsening lower extremity edema, chest pain, or shortness of breath. She complains of significant back pain since the surgery.   PAST MEDICAL HISTORY:  1. Osteoarthritis. 2. Chronic back pain.  3. History of multiple back surgeries.  4. History of deep venous thrombosis and pulmonary embolus, on chronic anticoagulation.   PAST SURGICAL  HISTORY:  1. Back surgery multiple times, last one before two weeks at [facility].  2. Left hip replacement.  3. Right knee replacement surgery.  4. History of intestinal rupture with colostomy and then reversal of colostomy.   FAMILY HISTORY: Father died from CVA. Sister had CVA as well.   SOCIAL HABITS: Nonsmoker. No history of alcohol abuse.   SOCIAL HISTORY: The patient has been living in a nursing home since her recent surgery as subacute rehab.   HOME MEDICATIONS:  1. Dulcolax suppository one every 24 hours as needed.  2. Ferrous sulfate 325 mg oral daily.  3. Flexeril 5 mg twice a day as needed.  4. Lyrica 50 mg two tablets daily.  5. Milk of Magnesia 400 mg as needed for constipation twice a day.  6. Multivitamin 1 tablet daily.  7. Oxycodone 5 mg one tablet every four hours as needed.  8. Prilosec 40 mg oral daily.  9. Senna 1 tablet oral twice a day.  10. Tylenol extra-strength 500 mg once every four hours as needed.  11. Ultram 50 mg every six hours as needed.  12. Warfarin 3 mg by mouth at bedtime.  13. Lovenox 40 mg subcutaneous for deep vein thrombosis prophylaxis until INR is more than 2.  14. Trazodone 150 mg oral at bedtime.  15. MiraLax 17 grams daily as needed for constipation.  16. Mylanta 30 mL daily as needed.   ALLERGIES: Penicillin, codeine, and Keflex.   REVIEW OF SYSTEMS: CONSTITUTIONAL: The patient has fever. Complains of weakness and fatigue. EYES: Denies blurry vision, double vision, or pain. ENT: Denies tinnitus, ear  pain, or hearing loss. RESPIRATORY: Denies cough, wheezing, hemoptysis, or dyspnea. CARDIOVASCULAR: Denies chest pain, orthopnea, edema, arrhythmia, or palpitations. GASTROINTESTINAL: Denies nausea, vomiting, diarrhea, abdominal pain, and hematemesis. GENITOURINARY: Denies dysuria, hematuria, or renal colic. ENDOCRINE: Denies polyuria, polydipsia, or heat or cold intolerance. HEMATOLOGY: Denies any anemia or easy bruising. Has history of  pulmonary embolism and deep vein thrombosis. INTEGUMENTARY: Denies any acne, rash, or lesions. MUSCULOSKELETAL: Complains of arthritis with significant pain all over with lower back pain as well. Denies any gout, swelling, or cramps. NEUROLOGIC: Denies numbness, dysarthria, epilepsy, tremors, or vertigo. PSYCH: Denies insomnia, anxiety, bipolar, or schizophrenia disorder.   PHYSICAL EXAMINATION:   VITAL SIGNS: Temperature 98.9, T-max 101.1, pulse 78, respiratory rate 18, blood pressure 101/47, and saturating 99% on 2 liters nasal cannula.   GENERAL: Elderly, frail female who lies comfortable in bed, in no apparent distress.   HEENT: Head atraumatic, normocephalic. Pupils equal and reactive to light. Pink conjunctivae. Anicteric sclerae. Moist oral mucosa.   NECK: Supple. No thyromegaly. No JVD.   PULMONARY: Chest has good air entry bilaterally. No wheezing, rales, and rhonchi.   CARDIOVASCULAR: S1 and S2 heard. No rubs, murmur, or gallops.   ABDOMEN: Soft, nontender, and nondistended. Bowel sounds present.   EXTREMITIES: No edema. No clubbing. No cyanosis.   SKIN: The patient had right joint surgical scar, healed nicely. No discharge or oozing. As well the patient had back surgical scars which are healing without any erythema or discharge at the site.   PSYCHIATRIC: The patient is awake, alert, and oriented x3. Appropriate affect. Intact judgment and insight.   NEURO: Cranial nerves II through XII grossly intact. Motor five out of five in all extremities.   PERTINENT LABS/RADIOLOGIC STUDIES: BNP 546. Glucose 138, BUN 18, creatinine 0.92, sodium 135, potassium 4.3, chloride 97, CO2 29, total protein 6.3, albumin 2.2, total bilirubin 0.6, alkaline phosphatase 116, AST 17, and ALT 35. White blood cells 2.4, hemoglobin 9.7, hematocrit 28.8, and platelets 137.   Urinalysis is negative for nitrites and leukocyte esterase and 2 white blood cells.    ASSESSMENT AND PLAN:  1. Systemic  inflammatory response syndrome. The patient is hypotensive, febrile, and leukopenic. Etiology of infection is unclear, but the patient's urinalysis is negative and chest x-ray does not show any pneumonia. Blood cultures were sent. The patient will be started on broad-spectrum IV antibiotic, IV vancomycin, and Azactam (secondary to penicillin allergy). We will follow on the blood cultures. The patient had two recent surgeries and so there is probability there is infection at the surgical site. The patient's wounds look clean, but if her blood cultures come back positive, the patient will need MRI of her back to rule out any source of infection. As well the patient will be continued on IV fluids.  2. History of pulmonary embolism and deep vein thrombosis. The patient's INR is subtherapeutic, 1.2, and will be started on subcutaneous Lovenox 1 mg/kg every 12 hours until her INR is therapeutic, more than 2, then her subcutaneous Lovenox can be discontinued. We will consult for pharmacy to dose her warfarin.  3. Chronic pain syndrome. We will continue the patient on p.r.n. oxycodone and tramadol.  4. Deep vein thrombosis prophylaxis. The patient is on therapeutic dose anticoagulation.  5. GI prophylaxis. The patient is on a PPI.   CODE STATUS: The patient is DO NOT RESUSCITATE/DO NOT INTUBATE as discussed with the patient and this was her wishes.   TOTAL TIME SPENT ON ADMISSION AND PATIENT CARE: 87  minutes.  ____________________________ Starleen Armsawood S. Aasiyah Auerbach, MD dse:slb D: 06/01/2012 06:40:56 ET T: 06/01/2012 10:45:18 ET JOB#: [id number]  cc: Starleen Armsawood S. Avani Sensabaugh, MD, <Dictator> Burley SaverL. Katherine Bliss, MD Luciana Cammarata Teena IraniS Stephany Poorman MD ELECTRONICALLY SIGNED 06/02/2012 0:20

## 2014-12-28 NOTE — H&P (Signed)
PATIENT NAME:  Tracey Hopkins, Tracey Y MR#:  [id number] DATE OF BIRTH:  06/09/1926  DATE OF ADMISSION:  06/11/2012  REASON FOR ADMISSION: Weakness, fever, difficulty breathing, worsening cough.   PRIMARY CARE PHYSICIAN: Tracey BignessKatherine Bliss, MD   REFERRING PHYSICIAN: Si Raiderhristine Braud, MD    HISTORY OF PRESENT ILLNESS: Tracey Hopkins is a very nice 81 year old female who has history of chronic back pain for which she has had multiple back problems. The last one was done on September [facility]th [facility] and it was due to a significant fracture with nerve impingement for what she got C-spine to sacrum laminectomy. She also has prior back surgery, history of a DVT and a pulmonary embolus and she has been on Coumadin since then. The patient was recently admitted to [facility] on 06/01/2012 with a systemic inflammatory response syndrome. She had bacteriuria and she was transferred to [facility] due to a possible surgical wound infection in the mid thoracic area. She also was hypotensive and pancytopenic. At [facility] the patient was evaluated and during that hospitalization developed significant stridor. She was diagnosed with viral laryngitis and upper airway edema and she received some steroids and it resolved. The patient was treated with antibiotics, Rocephin, due to systemic inflammatory syndrome but those were stopped. She had developed diarrhea and positive Clostridium difficile for what she was also treated with metronidazole. She had a CT of the thoracic spine that did not show any significant abscesses or localized infection. Urine culture on the 23rd grew out Enterococcus and Escherichia coli. I was not able to obtain a discharge summary from that hospitalization but I know the patient was completely treated for this infection. Overall after she was discharged to skilled nursing facility, the family has not been content with the care right there and they were trying to transfer to another facility. Unfortunately, before that  happened the patient started getting weaker, not being able to eat for the past 3 to 4 days, and having difficulty breathing with increased cough, worsening chest congestion, and a fever of 100.5. The patient has been lethargic today, very sleepy, with significant decreased appetite. In the past she had significant sore throat which has resolved now. The patient has been transferred to [facility] clinic where she has been diagnosed with pneumonia. Her chest x-ray showed an abnormal density in the left lower lobe consistent with atelectasis versus pneumonia and nodularity on the right mid lung unchanged since September 22nd but new since January. Follow-up x-ray is recommended as well as possibility of CT scan if no clearance. The patient has been put on vancomycin and Levaquin by the ER physician and I am adding on meropenem due to her risk of Pseudomonas since she has healthcare-acquired pneumonia. She is also allergic to penicillins. During this time in the ER, the patient was hypotensive with blood pressures that went down in the 90's over 60's but now her blood pressure is 130's/60's after IV fluids were given. The patient stated she is not able to cough up sputum but she feels really congested and her cough is very annoying. Denies any chest pain and her back pain seems to be stable.   REVIEW OF SYSTEMS: Positive for fever today. Positive for fatigue and weakness. Negative weight loss. EYES: Denies any blurry vision, pain in her eyes, or inflammation. ENT: Denies any tinnitus, allergy symptoms, or postnasal drip. RESPIRATORY: Positive cough. Positive shortness of breath. Positive increased secretions and chest congestion, unable to expectorate. Positive pneumonia. CARDIOVASCULAR: Denies any chest pain, orthopnea,  or syncope. GI: Denies any abdominal pain, melena, or constipation. GU: Denies any dysuria or hematuria. ENDOCRINE: Denies any polyuria, polydipsia, polyphagia, heat or cold intolerance, or  thyroid problems. HEMATOLOGIC/LYMPHATIC: Negative for easy bruising, bleeding, or swollen glands. SKIN: Without any significant lesions or rashes, only dry skin and positive erythema with fungal infection on her bottom. MUSCULOSKELETAL: Positive back pain, chronic. NEUROLOGIC: No CVA, numbness or tingling on the skin. PSYCHIATRIC: Negative for depression or anxiety.   PAST MEDICAL HISTORY:  1. Osteoarthritis.  2. Chronic back pain.  3. History of DVT.  4. History of pulmonary embolus.  5. Multiple back surgeries.  6. History of Clostridium difficile infection.  7. 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    ALLERGIES: The patient is allergic to Keflex, codeine, and penicillin. Rash was the reaction of the penicillin and Keflex.   PAST SURGICAL HISTORY:  1. Positive for multiple back surgeries. Last one was on 05/24/2012 due to compression fracture and pinching nerve. The patient has had C-spine to sacrum laminectomy.  2. Left hip replacement about three years ago after fracture.  3. Right total knee replacement on 04/23/2012. 4. History of intestinal rupture with colostomy and then reversal of colostomy and removal of cancerous tumor.   FAMILY HISTORY: Positive for coronary artery disease in her father. Positive for cancer in several members of her family but she is unable to remember what type of cancer. History of cerebrovascular accident in her father. Her sister also had a CVA.   SOCIAL HISTORY: The patient used to live with her daughter but after all of these hospitalizations she has been transferred to a skilled nursing facility where she is doing some recovery. She has never smoked. She does not drink. She is a widow.   MEDICATIONS:  1. Advair Diskus one puff every 12 hours of 100 mcg/50 mcg. 2. Bactrim DS 800/160 orally for seven days once daily.  3. Bisacodyl rectal suppository p.r.n.  4. Coumadin 3 mg once daily but it was not being given at the nursing  home apparently.  5. Ferrous sulfate 325 mg once daily.  6. Flagyl 500 mg every eight hours for seven days to be continued until 06/12/2012, so one more day.  7. Lyrica 50 mg twice daily.  8. Megace 625 mg once daily.  9. Multivitamins orally once daily.  10. Omeprazole 40 mg once daily.  11. OxyContin 5 mg tablets every three hours. 12. Polyethylene glycol once daily.  13. Robaxin 750 mg every six hours as needed. 14. Robitussin 100 mg/5 mL liquid 4 times daily. 15. Senokot twice daily.  16. Simethicone 125 mg p.r.n. abdominal upset. 17. Spiriva 18 mcg once daily.  18. Tramadol 50 mg 1 to 2 every four hours p.r.n. pain.  19. Trazodone 150 mg once daily.  20. Tylenol p.r.n.  21. Zofran p.r.n.   PHYSICAL EXAMINATION:   VITAL SIGNS: Blood pressure 105/72, pulse 87, respiratory rate 22, temperature 99.6, 90% on room air.   GENERAL: The patient is elderly, frail, comfortable in bed without any significant distress except when she coughs. She is coughing continuously on the second part of my interview. Hemodynamically stable now.   HEENT: Normocephalic, atraumatic. Pupils equal and reactive. Extraocular movements intact. Pink conjunctivae. Anicteric sclerae. Mucosa dry. No oral lesions. Positive thrush.   NECK: Supple. No thyromegaly. No JVD. No auscultation of bruits. Trachea central. No stridor.   CARDIOVASCULAR: Regular rate and rhythm. No murmurs, rubs, or gallops. Apical heart tones are nondisplaced.  PULMONARY: Positive rales, diffuse, in both lungs more on the left lower lobe. Negative wheezing. No tubular sounds. No dullness to percussion.  ABDOMEN: Soft, nontender, nondistended. No hepatosplenomegaly. No masses. Bowel sounds are positive. No guarding. No rebound.   EXTREMITIES: No edema, no cyanosis, no clubbing. Pulses +2.   SKIN: No significant skin infections at the level of the spine. Positive erythema, likely fungal infection on groins and bottom at the level of the  sacral area.   PSYCHIATRIC: The patient is oriented x3. Appropriate affect without any significant agitation or depression.   NEUROLOGIC: Cranial nerves II through XII intact. Strength is 5 out of 5 in all four extremities. No significant focal findings.   LYMPHATIC: Negative for lymphadenopathy on neck or supraclavicular.   MUSCULOSKELETAL: No significant joint deformity. Positive incision from C-spine to sacrum on the back. No significant point tenderness. No significant abscesses.   GENITAL: Negative for external lesions.   LABORATORY, DIAGNOSTIC, AND RADIOLOGICAL DATA: Glucose 120, BUN 21, creatinine 1.12, sodium 133, GFR with croffort method is around 20, calcium 7.9 with low albumin of 2.2. Normal LFTs. Hemoglobin 9.7, white count 5.4, platelets 176. INR is 1.7. Urinalysis 2 white blood cells, 21 red blood cells.   Chest x-ray as mentioned in history of present illness left-sided possible pneumonia and right irregular nodule.   ASSESSMENT AND PLAN:  1. Healthcare-acquired pneumonia. The patient has been in the hospital several times, received different antibiotics for which she is at risk of Pseudomonas and MRSA. The patient has been covered with two antibiotics for Pseudomonas, Levaquin which has been given as a renal dose every 48 hours, and also meropenem. She is allergic to penicillin and Keflex but those have been minor reactions for what thing the patient could tolerate meropenem. Meropenem has been renally dosed at 250 mg every 12 hours. Continue nebs. Continue pulmonary toilet. Check for Legionella and strep antibodies. The patient has fever, hypoxia tachypnea, and hypotension. Hypotension has resolved.  2. SIRS evidenced with tachycardia, tachypnea, and hypotension. The patient is on broad-spectrum antibiotics. She has been polycultured.  3. Hypoxemia. Continue oxygen and nasal cannula.  4. Chronic back pain status post laminectomy C-spine to sacrum. At this moment that is stable.   5. History of DVT. The patient has not been taking warfarin. Apparently she was accidentally taken off as per her daughter. Her INR is 1.7. Warfarin was still on the list of medications given there. We are going to give Lovenox 1 mg/kg once daily due to her renal function and start Coumadin. Stop Lovenox once INR reaches 2.  6. Chronic kidney disease. GFR is around 20. Those medications are appropriate.  7. History of viral laryngitis. The patient had treatment with steroids at [facility]. At this moment she does not have any stridor which was the main thing that happened during the previous hospitalization. This issue has resolved.  8. History of Clostridium difficile, currently treated with Flagyl.  9. Other medical problems seem to be stable. The patient does have anemia which is likely acute blood loss anemia from the surgery. Continue iron therapy.  10. Hyponatremia, stable. IV fluids given. If worsening, consider urinary test with urine sodium and urine osmolality.   TIME SPENT: At this moment I have spent 90 minutes with this patient reviewing data, discussing the case with ER doctor and family, and reviewing records from [place] and records from [facility] as well.     CODE STATUS: The patient is a DO NOT RESUSCITATE.  ____________________________ Felipa Furnace, MD rsg:drc D: 06/12/2012 00:07:42 ET T: 06/12/2012 06:15:19 ET JOB#: [id number]  cc: Felipa Furnace, MD, <Dictator> Burley Saver, MD Pearletha Furl MD ELECTRONICALLY SIGNED 06/12/2012 14:14

## 2014-12-28 NOTE — Discharge Summary (Signed)
PATIENT NAME:  Tracey Hopkins, Tracey Hopkins MR#:  [id number] DATE OF BIRTH:  15-May-1926  DATE OF ADMISSION:  04/23/2012 DATE OF DISCHARGE:  04/26/2012  ADMITTING DIAGNOSIS: Degenerative arthrosis of the right knee.   DISCHARGE DIAGNOSIS: Degenerative arthrosis of the right knee.   HISTORY: The patient is a pleasant 81 year old who has been followed at [facility] for progression of right knee pain. She has a long history of bilateral knee pain with the right being more symptomatic than the left. Her pain was noted to be aggravated with weight-bearing activities. She had localized the right knee pain to the lateral aspect of the knee. She does report some significant medial left knee pain.  She states that she has not seen any significant improvement in her condition despite the use of Mobic, intraarticular cortisone injections, as well as Synvisc injections. Her pain had progressed to the point that was significantly interfering with her activities of daily living. [facility] taken in the [facility]opedic department showed narrowing of the lateral compartment space with associated valgus deformity. Osteophyte as well as subchondral sclerosis was noted. After discussion of the risks and benefits of surgical intervention, the patient expressed her understanding of the risks and benefits and agreed with plans for surgical intervention.   PROCEDURE: Right total knee arthroplasty using computer-assisted navigation.   ANESTHESIA: Femoral nerve block with general.   SOFT TISSUE RELEASE: Anterior cruciate ligament, posterior cruciate ligament, deep medial collateral ligaments, as well as the patellofemoral ligament.   IMPLANTS UTILIZED: DePuy PFC Sigma size 2.5 posterior stabilized femoral component (cemented), size 2.5 MBT tibial component (cemented), 32-mm three-pegged oval dome patella (cemented), and a 10-mm stabilized rotating platform polyethylene insert.   HOSPITAL COURSE: The patient tolerated the  procedure very well. She had no complications. She was then taken to the PAC-U where she was stabilized and then transferred to the orthopedic floor. The patient began receiving anticoagulation therapy of Lovenox 30 mg subcutaneous every 12 hours per anesthesia and pharmacy protocol. She was fitted with TED stockings bilaterally. These were allowed to be removed one hour per eight-hour shift. The right one was applied on day two following removal of the Hemovac and dressing change. She was also fitted with the AV-I compression foot pumps bilaterally set at 80 mmHg. She has voiced no complaints of pain in the lower extremity. No evidence of any deep venous thromboses. Calves were nontender. Negative Homans sign. Heels were elevated off the bed using rolled towels.   The patient has denied any chest pain or shortness of breath. Vital signs have been stable. She has been afebrile. Hemodynamically she was stable. The patient's hemoglobin did drop to 7.7 on day two and subsequently due to the patient's complaint of significant weakness, one unit of packed RBCs was administered. Otherwise, she has been asymptomatic and doing well.   Physical therapy was initiated on day one for gait training and transfers. She has been very slow to progress due to the  weakness and instability of the left knee. Upon being discharged, she was ambulating approximately 30 to 45 feet. Occupational therapy was also initiated on day one for activities of daily living and assistive devices.   DISPOSITION: The patient is being discharged to a skilled nursing facility in improved stable condition.   DISCHARGE INSTRUCTIONS:  1. She may weight bear as tolerated.  2. Continue with TED stockings.  These are allowed to be removed one hour per eight-hour shift.  3. Incentive spirometer q.1 hour while awake.  4.  Encourage cough and deep breathing every two hours while awake.  5. Polar Care to the surgical leg maintaining a temperature of 40  to 50 degrees Fahrenheit.  6. Physical therapy for gait training and transfers.  7. Occupational therapy for activities of daily living and assistive devices.  8. She is placed on a regular diet.  9. She has a follow-up appointment on 08/30 at 1:30 with Cranston Neighbor, PA-C,  and on 09/26 at 1:45 with Dr. Francesco Sor.   10. She is to call the clinic sooner if any temperatures of 101.5 or greater or excessive bleeding.   DRUG ALLERGIES: Codeine, Keflex, penicillin.   MEDICATIONS:  1. Tylenol ES 500 to 1000 mg every four hours p.r.n.  2. Oxycodone 5 to 10 mg every four hours p.r.n.  3. Ultram 50 to 100 mg every four hours p.r.n.  4. Dulcolax suppository 10 mg rectally daily p.r.n.  5. Milk of Magnesia 30 mL twice a day p.r.n.  6. Enema soapsuds p.r.n. if no results with Milk of Magnesia or Dulcolax.  7. Mylanta DS 30 mL every six hours. 8. Senokot-S 1 tablet b.i.d.  9. Ferrous sulfate 325 mg daily with meals.  10. Multivitamin capsule, 1 capsule per day.  11. Desyrel 150 mg at bedtime.  12. Lyrica 50 mg b.i.d.  13. Prilosec 40 mg q. 6 a.m.  14. Coumadin 10 mg q. 5:00 p.m.   Will need to check INR on a daily basis.   PAST MEDICAL HISTORY:  1. Pulmonary emboli.  2. Osteoarthritis.  3. Hiatal hernia.  4. Chickenpox.   ____________________________ Van Clines, PA [id number]:[id number] D: 04/25/2012 12:57:00 ET T: 04/25/2012 13:55:37 ET JOB#: [id number]  cc: Van Clines, PA, <Dictator> Tiegan Jambor PA ELECTRONICALLY SIGNED 04/26/2012 21:35

## 2014-12-31 NOTE — Op Note (Signed)
PATIENT NAME:  Tracey Hopkins, Tracey Hopkins MR#:  [id number] DATE OF BIRTH:  12-09-1925  DATE OF PROCEDURE:  12/15/2012  PREOPERATIVE DIAGNOSIS: Degenerative arthrosis of the left knee.   POSTOPERATIVE DIAGNOSIS: Degenerative arthrosis of the left knee.   PROCEDURE PERFORMED: Left total knee arthroplasty using computer-assisted navigation.   SURGEON: Illene LabradorJames P. Hooten, MD  ASSISTANT:  Van ClinesJon Wolfe, PA  ANESTHESIA: General.   ESTIMATED BLOOD LOSS: 50 mL.   FLUIDS REPLACED: 2000 mL of crystalloid.   TOURNIQUET TIME: 88 minutes.   DRAINS: Two medium drains to reinfusion system.   SOFT TISSUE RELEASES: Anterior cruciate ligament, posterior cruciate ligament, deep medial collateral ligament, and patellofemoral ligament.   IMPLANTS UTILIZED: DePuy PFC Sigma size 3 posterior stabilized femoral component (cemented), size 3 MBT tibial component (cemented), 32 mm 3-peg oval dome patella (cemented), and a 12.5 mm stabilized rotating platform polyethylene insert. Gentamicin bone cement was utilized due to the patient's previous history.   INDICATIONS FOR SURGERY: The patient is an 81 year old female who has been seen for complaints of severe progressive left knee pain. She denied any significant improvement despite conservative nonsurgical intervention. The pain had increased to the point that it was significantly interfering with her activities of daily living. After discussion of the risks and benefits of surgical intervention, the patient expressed understanding of the risks and benefits and agreed with plans for left total knee arthroplasty.   PROCEDURE IN DETAIL: The patient was brought into the operating room and, after adequate general anesthesia was achieved, a tourniquet was placed on the patient's upper left thigh. The patient's left knee and leg were cleaned and prepped with alcohol and DuraPrep and draped in the usual sterile fashion. A "timeout" was performed as per usual protocol. The left lower  extremity was exsanguinated using an Esmarch, and the tourniquet was inflated to 300 mmHg. An anterior longitudinal incision was made followed by a standard mid vastus approach. A moderate effusion was evacuated. The deep fibers of the medial collateral ligament were elevated in a subperiosteal fashion off the medial flare of the tibia so as to maintain a continuous soft tissue sleeve. The patella was subluxed laterally and the patellofemoral ligament was excised. Prominent lateral osteophytes were debrided from the patella. Inspection of the knee demonstrated severe degenerative changes in tricompartmental fashion with full thickness loss of articular cartilage to medial, lateral, and patellofemoral articulation. Anterior and posterior cruciate ligaments were excised. Prominent osteophytes were debrided using a rongeur. Two 4.0 mm Schanz pins were inserted into the femur and into the tibia for attachment of the array of trackers used for computer assisted navigation. Hip center was identified using a circumduction technique. Distal landmarks were mapped using the computer. The distal femur and proximal tibia were mapped using the computer. The distal femoral cutting guide was positioned using computer-assisted navigation so as to achieve 5 degree distal valgus cut. Cut was performed and verified using the computer. The patient's bone was noted be extremely osteoporotic in nature and care was taken to protect the bone throughout the procedure. Distal femur was sized and it was felt that a size 3 femoral component was appropriate. Size 3 cutting guide was positioned and the anterior cut was performed and verified using the computer. This was followed by completion of the posterior and chamfer cuts. Femoral cutting guide for the central box was then positioned and the central box cut was performed.   Attention was then directed to the proximal tibia. Medial and lateral menisci were excised. The extramedullary  tibial  cutting guide was positioned using computer-assisted navigation so as to achieve 0 degree varus valgus alignment and 0 degree posterior slope. Cut was performed and verified using the computer. The proximal tibia was sized and it was felt that a size 3 tray was appropriate. Femoral and tibial trials were inserted followed by insertion of a 10 mm and subsequently a 12.5 mm polyethylene insert. Excellent mediolateral soft tissue balancing was appreciated both in full extension and in flexion. Finally, the patella was cut and prepared so as to accommodate a 32 mm 3-peg oval dome patella. Patellar trial was placed and the knee was placed through range of motion with excellent patellar tracking appreciated. The femoral trial was removed after debridement of posterior osteophytes. Central post hole for the tibial component was reamed followed by insertion of a keel punch. Femoral trials were removed. Cut surfaces of bone were irrigated with copious amounts of normal saline with antibiotic solution using pulsatile lavage and then suctioned dry. Polymethyl methacrylate cement with gentamicin was prepared in the usual fashion using a vacuum mixer. Cement was applied to the cut surface of the proximal tibia as well as along the undersurface of a size 3 MBT tibial component. The tibial component was positioned and impacted into place. Excess cement was removed using freer elevators. Cement was then applied to the cut surface of the femur as well as along the posterior flanges of a size 3 posterior stabilized femoral component. Femoral component was positioned and impacted into place. Excess cement was removed using freer elevators. A 12.5 mm polyethylene trial was inserted and the knee was brought into full extension with steady axial compression applied. Finally, cement was applied to the backside of a 32 mm 3-pegged oval dome patella and the patellar component was positioned and patellar clamp applied.   After adequate  curing of cement, the tourniquet was deflated after total tourniquet time of 88 minutes. Hemostasis was achieved using electrocautery. The knee was irrigated with copious amounts of normal saline with antibiotic solution using pulsatile lavage and then suctioned dry. The knee was inspected for any residual cement debris. 30 mL of 0.25% Marcaine with epinephrine was injected along the posterior capsule. A 12.5 mm stabilized rotating platform polyethylene insert was inserted and the knee was placed through a range of motion. Excellent mediolateral soft tissue balancing was appreciated both in full extension and in flexion. Excellent patellar tracking was noted. Two medium drains were placed in the wound bed and brought out through a separate stab incision to be attached to a reinfusion system. The medial parapatellar portion of the incision was reapproximated using interrupted sutures of #1 Vicryl. The subcutaneous tissue was approximated in layers using first #0 Vicryl followed by #2-0 Vicryl. Skin was closed with skin staples. Sterile dressing was applied.   The patient tolerated the procedure well. She was transported to the recovery room in stable condition.  ____________________________ Illene Labrador. Angie Fava., MD jph:sb D: 12/15/2012 10:59:56 ET T: 12/15/2012 11:16:11 ET JOB#: [id number]  cc: Fayrene Fearing P. Angie Fava., MD, <Dictator> Illene Labrador Angie Fava MD ELECTRONICALLY SIGNED 12/16/2012 19:52

## 2014-12-31 NOTE — Discharge Summary (Signed)
PATIENT NAME:  Tracey Hopkins, Tracey Hopkins MR#:  [id number] DATE OF BIRTH:  04-27-1926  Dictated for Francesco SorJames Hooten, MD  DATE OF ADMISSION:  12/15/2012 DATE OF DISCHARGE:  12/18/2012  ADMITTING DIAGNOSIS: Degenerative arthrosis of the left knee.   DISCHARGE DIAGNOSIS: Degenerative arthrosis of the right knee.   HISTORY: The patient is a pleasant 81 year old who has been followed at the [facility] for progression of bilateral knee pain. She had previously undergone a right total knee arthroplasty in August 2013 and did very well. However, since that time she has continued to have discomfort with the left knee. She also last fall had surgery on the lumbar spine. However, the leg pain is improved but she continued to have pain to the left knee. She states that the left knee pain was aggravated with weightbearing activities. She had noticed a decrease in her range of motion. She was noted to have a hemarthrosis in January and only saw temporary relief from the pain following the aspiration as well as an intraarticular cortisone injections. The patient localized most of the pain along the medial aspect of the knee. Her pain had progressed to the point that it was significantly interfering with her activities of daily living. [facility] showed narrowing of the medial cartilage space with associated varus alignment. Osteophyte as well as subchondral sclerosis was noted. After discussion of the risks and benefits of surgical intervention, the patient expressed her understanding of the risks and benefits and agreed for plans for surgical intervention.   HOSPITAL COURSE:  PROCEDURE: Left total knee arthroplasty using computer-assisted navigation.  ANESTHESIA: General, secondary to back surgery.  SOFT TISSUE RELEASE: Anterior cruciate ligament, posterior cruciate ligament, deep medial collateral ligaments, as well as patellofemoral ligament.  IMPLANTS UTILIZED: DePuy PFC Sigma, size 3  posterior stabilized femoral component (cemented), size 3 MBT tibial component (cemented), 32 mm 3-pegged oval dome patella (cemented), and a 12.5 mm stabilized rotating platform polyethylene insert. Gentamicin bone cement was utilized due to the patient's previous histories.   The patient tolerated the procedure very well. She had no complications. She was then taken to the PACU where she was stabilized, then transferred to the orthopedic floor. The patient began receiving anticoagulation therapy of Lovenox 30 mg subQ q. 12 hours per anesthesia protocol. She was fitted with TED stockings bilaterally. These were allowed to be removed 1 hour per  8-hour shift. The right one was applied on day 1 following removal of the Hemovac and dressing change. The patient was also fitted with AVI compression foot pumps bilaterally, set at  80 mmHg. Her calves have been nontender. There has been no evidence of any DVTs. Negative Homans sign. Heels were elevated off the bed using rolled towels.   The patient had denied any chest pain or shortness of breath. Vital signs have been stable. She has been afebrile. Hemodynamically she is stable, and no transfusions were given other than the Autovac transfusion given the first 6 hours postoperatively. Day 2 following surgery her hemoglobin was recorded as 7.1, however six hours later a repeat hemoglobin was obtained which showed a hemoglobin of 8.6, which had actually come up from day 1 following surgery. She was asymptomatic during this timeframe.   Physical therapy was initiated on day 1 for gait training and transfers. She has done very well in regards to range of motion, but ambulation has been extremely slow. Occupational therapy was also initiated on day 1 for ADLs and assistive devices.  The patient was treated preoperatively for a urinary tract infection. However during the catheterization in the OR her urine still looked to be extremely cloudy, and subsequently she was  placed on Levaquin 250 mg daily. Will continue this for 5 days.   The patient is being discharged to a skilled nursing facility in improved, stable condition. She may weight-bear as tolerated. Continue using a walker until cleared by physical therapy to go to a quad cane.   Continue with TED stockings bilaterally. These are allowed to be removed 1 hour per 8 hour shift. Continue with Polar Care to the surgical leg, maintaining a temperature of 40 to 50 degrees Fahrenheit.   Elevate heels off the bed.   Incentive spirometer q.1 h. while awake.   Encourage cough, deep breathing q. 2 hours while awake.   She was placed on a regular diet.   Change dressing as needed.   She has a followup appointment on April 22nd at 9:15. Call the [facility] for any complications or excessive bleeding.   DRUG ALLERGIES: CODEINE, KEFLEX, MORPHINE, PENICILLIN, TAPE.   MEDICATIONS: Senokot-S 1 tablet b.i.d., ferrous sulfate 325 mg daily, Levaquin 250 mg for  3 more days, Prilosec 40 mg b.i.d., Desyrel 150 mg at bedtime, Coumadin 3 mg q. 5 p.m. starting on 12/19/2012, Tylenol 500 to 1000 mg q.4 to 6 hours p.r.n. for pain or temperatures of greater than 100.4, Mylanta DS 30 mL q.6 hours p.r.n., Dulcolax suppositories 10 mg rectally daily, p.r., p.r.n., lactulose 15 grams/15 mL syrup, 30 mL b.i.d., p.r.n., milk of magnesia 30 mL b.i.d., methocarbamol 750 mg q. 6 hours p.r.n., Roxicodone 5 to 10 mg q.3 hours p.r.n. for pain, Phenergan 25 mg q.6 hours p.r.n., Tramadol 50 to 100 mg q. 4-6 hours p.r.n. for pain, Benadryl 25 mg q.6 h. p.r.n. for itching and allergic reactions; enema, soapsuds if no results with milk of magnesia or Dulcolax.   PAST MEDICAL HISTORY: 1.  Pulmonary embolus.  2.  Arthritis.  3. Hiatal hernia.     ____________________________ Van Clines, PA jrw:dm D: 12/18/2012 07:33:24 ET T: 12/18/2012 07:51:20 ET JOB#: [id number]  cc: Van Clines, PA, <Dictator> Nashira Mcglynn PA ELECTRONICALLY SIGNED  12/18/2012 21:24

## 2015-01-02 NOTE — Consult Note (Signed)
Brief Consult Note: Diagnosis: Anemia with the finding of heme positive stool during admission.  History of colon cancer.  Abdominal pain epigstric as well as left lower quadrant.  Abdominal ultrasound revealed evidence of gallstones.   Consult note dictated.   Discussed with Attending MD.   Comments: Patient's presentation discussed with Dr. Lutricia FeilPaul Oh.  Recommendation is [facility]  Once reviewed by Dr. Bluford Kaufmannh the decision will be made about when to proceed with endoscopy evaluation.  Coagulability state will need to be corrected to the goal of INR <1.5.  Further GI recommendation will follow once seen by Dr. Bluford Kaufmannh.  Electronic Signatures: Rodman KeyHarrison, Dawn S (NP)  (Signed 05-Jul-13 13:27)  Authored: Brief Consult Note   Last Updated: 05-Jul-13 13:27 by Rodman KeyHarrison, Dawn S (NP)

## 2015-01-02 NOTE — Consult Note (Signed)
History of Present Illness:   Reason for Consult Anemia, thrombocytopenia, leukopenia    Date of Diagnosis 14-Oct-2011    HPI   81 year old lady was admitted in the hospital to emergency room.  Patient had recently diagnosed  with urinary  tract infection treated as outpatient and finished antibiotic a week ago however patient's symptoms did not improve.  Patient gradually developed fatigue.patient was admitted in the hospital was given IV fluidwas found to have a leukopenia neutropenia and anemia.  According to patient few months the goal patient's primary care physician found patient to have neutropenia and was referred to a hematologist in [place] [facility] Murvin Natal) complete workup was done I do not have any access to the record.  According to patient white count improved and this was advised to get followup in [facility] evaluation.  Patient also has some history of deep vein thromboses on chronic Coumadin therapy.  And according to patient she had previous history of intestinal obstruction and rupture and neck followedcolon cancer many years ago. after coming to the hospital patient is feeling better.  Nausea and vomiting is better intake is improving.  I was asked to  evaluate patient 4 neutropenia and anemia  PFSH:   Comments No family history of colorectal cancer, breast cancer, or ovarian cancer.      History of cerebrovascular accident and coronary artery disease in the family    Social History negative alcohol    Additional Past Medical and Surgical History Osteoarthritis  Deep vein thrombosis on chronic Coumadin therapy h/o  intestinal obstruction.   Review of Systems:   General weakness  fatigue    Performance Status (ECOG) 2    HEENT no complaints    Lungs no complaints    Cardiac no complaints    GI nausea    GU h/o urinaryy tract infection    Musculoskeletal joint pain    Extremities swelling  Face    Skin no complaints    Neuro no  complaints    Psych no complaints   NURSING NOTES: **Vital Signs.:   03-Jul-13 13:18    Vital Signs Type: Routine    Temperature Temperature (F): 98.9    Celsius: 37.1    Temperature Source: Oral    Pulse Pulse: 76    Respirations Respirations: 18    Systolic BP Systolic BP: 409    Diastolic BP (mmHg) Diastolic BP (mmHg): 68    Mean BP: 88    Pulse Ox % Pulse Ox %: 92    Pulse Ox Activity Level: At rest    Oxygen Delivery: Room Air/ 21 %   Physical Exam:   General Patient is alert and oriented    HEENT: normal    Lungs: clear    Cardiac: regular rate, rhythm    Abdomen: soft  nontender  positive bowel sounds    Skin: intact    Extremities: edema  Trace    Neuro: AAOx3  cranial nerves intact    Psych: normal appearance    Physical Exam LYMPHATICS:   No cervical, axillary, or inguinal lymphadenopathy  Breast was not examined     Depression:    Dislocation, Hip- Left: 02-Oct-2009   Anxiety:    Hip Fracture - Left: 21-Sep-2009   Urinary Tract Infection: 21-Sep-2009   Pulmonary Embolus: 2007   [place] Filter: 2007   chronic back pain:    Rheumatoid Arthritis:    "back surgery": x 5, Sep 2010   hernia repair: 2001  Reversed Colostomy: 2000   intestinal rupture with colostomy placement: 1999   left hip hemiarthroplasty: 21-Sep-2009   knee surgeries: x 3-, 2002   rhinoplasty:    carpel tunnel release:    PCN: [facility] Status  Codeine: Other  Keflex: Unknown    coumadin 2.5 mg : daily except Friday, Active, 0, None   Coumadin 3 mg oral tablet:    on Fridays, Active, 0, None   roxicodone 47m : take 1-2 tabs every 4-6 hours as needed for pain, Active, 0, None   benadryl D Allergy Sinus 10 mg-25 mg: Active, 0, None   multivitamin: Active, 0, None   meloxicam 15 mg oral tablet:    , Active, 0, None   ferrous sulfate 325 mg (65 mg elemental iron) oral enteric coated tablet:    , Active, 0, None   docusate sodium 100 mg  oral capsule:   3 times a day , Active, 0, None   citrate +d: 630 milligram(s)  once a day, Active, 0, None   Vitamin B-12 1000 mcg oral tablet: 1 tab(s) orally once a day, Active, 0, None   fultarin:   , As Needed, Active, 0, None   Lyrica:  orally 2 times a day, Active, 0, None   trazodone 150 mg oral tablet: 1 tab(s) orally once a day (at bedtime), Active, 0, None   Prilosec:  orally once a day, Active, 0, None  Laboratory Results: Thyroid:  02-Jul-13 04:34    Thyroid Stimulating Hormone 1.34 (0.45-4.50 (International Unit)  ----------------------- Pregnant patients have  different reference  ranges for TSH:  - - - - - - - - - -  Pregnant, first trimetser:  0.36 - 2.50 uIU/mL)  Routine BB:  02-Jul-13 07:34    Crossmatch Unit 1 Ready (Result(s) reported on 11 Mar 2012 at 12:39PM.)   ABO Group + Rh Type A Positive   Antibody Screen NEGATIVE (Result(s) reported on 11 Mar 2012 at 08:36AM.)  Routine Chem:  02-Jul-13 04:34    Result Comment HGB DELTA - Delta check investigation form completed.  - No pre-analytical or analytical error was  - detectable. The cause of the delta check is  - unknown. Per [facility] and patient's nurse  - neither surgery, nor major hemorrhage  - occurred between current and previous  - testing. Results verified by recollect  - and repeat testing. RN notified@[email or web address] ..tp  Result(s) reported on 11 Mar 2012 at 06:55AM.   Result Comment calcium - RESULTS VERIFIED BY REPEAT TESTING.  - mpg c/ debbie pinkerton  - @ [phone]/2/13. read back process perform  - NOTIFIED OF CRITICAL VALUE  Result(s) reported on 11 Mar 2012 at 05:47AM.   Hemoglobin A1c ([facility]) 5.3 (The American Diabetes Association recommends that a primary goal of therapy should be <7% and that physicians should reevaluate the treatment regimen in patients with HbA1c values consistently >8%.)   Glucose, Serum 75   BUN  19   Creatinine (comp) 1.04   Sodium, Serum 138   Potassium, Serum 4.2    Chloride, Serum 107   CO2, Serum 23   Calcium (Total), Serum  7.0   Anion Gap 8   Osmolality (calc) 277   eGFR (African American)  57   eGFR (Non-African American)  49 (eGFR values <61mmin/1.73 m2 may be an indication of chronic kidney disease (CKD). Calculated eGFR is useful in patients with stable renal function. The eGFR calculation will not be reliable in acutely ill patients when serum creatinine  is changing rapidly. It is not useful in  patients on dialysis. The eGFR calculation may not be applicable to patients at the low and high extremes of body sizes, pregnant women, and vegetarians.)  25-WLS-[id number] 73:42    Folic Acid, Serum 7.7 (Result(s) reported on 12 Mar 2012 at 03:13PM.)   Result Comment WBC - RESULTS VERIFIED BY REPEAT TESTING.  - NOTIFIED OF CRITICAL VALUE  - CALLED TO KELLY ZIELKE@0533 ,03/12/12  - READ-BACK PROCESS PERFORMED.  - TPL  Result(s) reported on 12 Mar 2012 at 05:29AM.  Routine Coag:  02-Jul-13 04:34    Prothrombin  19.7   INR 1.6 (INR reference interval applies to patients on anticoagulant therapy. A single INR therapeutic range for coumarins is not optimal for all indications; however, the suggested range for most indications is 2.0 - 3.0. Exceptions to the INR Reference Range may include: Prosthetic heart valves, acute myocardial infarction, prevention of myocardial infarction, and combinations of aspirin and anticoagulant. The need for a higher or lower target INR must be assessed individually. Reference: The Pharmacology and Management of the Vitamin K  antagonists: the seventh ACCP Conference on Antithrombotic and Thrombolytic Therapy. AJGOT.1572 Sept:126 (3suppl): [id number]. A HCT value >55% may artifactually increase the PT.  In one study,  the increase was an average of 25%. Reference:  "Effect on Routine and Special Coagulation Testing Values of Citrate Anticoagulant Adjustment in Patients with High HCT Values." American Journal of  Clinical Pathology 2006;126:400-405.)  03-Jul-13 04:45    Prothrombin  18.9   INR 1.5 (INR reference interval applies to patients on anticoagulant therapy. A single INR therapeutic range for coumarins is not optimal for all indications; however, the suggested range for most indications is 2.0 - 3.0. Exceptions to the INR Reference Range may include: Prosthetic heart valves, acute myocardial infarction, prevention of myocardial infarction, and combinations of aspirin and anticoagulant. The need for a higher or lower target INR must be assessed individually. Reference: The Pharmacology and Management of the Vitamin K  antagonists: the seventh ACCP Conference on Antithrombotic and Thrombolytic Therapy. IOMBT.5974 Sept:126 (3suppl): [id number]. A HCT value >55% may artifactually increase the PT.  In one study,  the increase was an average of 25%. Reference:  "Effect on Routine and Special Coagulation Testing Values of Citrate Anticoagulant Adjustment in Patients with High HCT Values." American Journal of Clinical Pathology 2006;126:400-405.)  Routine Hem:  02-Jul-13 04:34    WBC (CBC)  2.3   RBC (CBC)  2.52   Hemoglobin (CBC)  7.1   Hematocrit (CBC)  21.4   Platelet Count (CBC) 163   MCV 85   MCH 28.1   MCHC 33.0   RDW  16.7   Neutrophil % 34.4   Lymphocyte % 47.7   Monocyte % 16.6   Eosinophil % 0.9   Basophil % 0.4   Neutrophil #  0.8   Lymphocyte # 1.1   Monocyte # 0.4   Eosinophil # 0.0   Basophil # 0.0 (Result(s) reported on 11 Mar 2012 at 06:55AM.)  03-Jul-13 04:45    WBC (CBC)  1.9   RBC (CBC)  2.61   Hemoglobin (CBC)  7.3   Hematocrit (CBC)  22.2   Platelet Count (CBC) 161   MCV 85   MCH 27.8   MCHC 32.6   RDW  16.3   Neutrophil % 31.4   Lymphocyte % 47.1   Monocyte % 18.9   Eosinophil % 1.9   Basophil % 0.7   Neutrophil #  0.6  Lymphocyte #  0.9   Monocyte # 0.4   Eosinophil # 0.0   Basophil # 0.0   Assessment and Plan:  Impression:   1.neutropenia  and anemia patient had workup for anemia and neutropenia as several months ago by  Hematologist record is notaccessible to me at this point in time. the history we patient did not have any x-rays or bone marrow aspiration and biopsy Anemia is normocytic normochromic with normal folic acid level Proceed with ultrasound of abdomen to evaluate for any hepato-or splenomegaly Patient can be discharged if he is clinically improved and will follow this patient as outpatient.  Will try to of pain and records from hematologic is and depending on patient's desire either can be followed up with    [facility]  hematologic oncology Department or  with Dr. Kristen Cardinal need bone marrow aspiration and biopsy to rule out any possibility of myelodysplastic syndrome.  I discussed all situation with patient.  Will follow this patient closely during the hospital stay  CC Referral:   cc: Dr. Clemmie Krill   Electronic Signatures: Jobe Gibbon (MD)  (Signed 03-Jul-13 17:33)  Authored: HISTORY OF PRESENT ILLNESS, PFSH, ROS, NURSING NOTES, PE, PAST MEDICAL HISTORY, ALLERGIES, HOME MEDICATIONS, LABS, ASSESSMENT AND PLAN, CC Referring Physician   Last Updated: 03-Jul-13 17:33 by Jobe Gibbon (MD)

## 2015-01-02 NOTE — Consult Note (Signed)
Pt seen and examined. See Dawn Harrison's notes. Heme positive stool with anemia On coumadin. Need to hold coumadin before endoscopic evaluatiions can be performed. Pt told not to repeat colon due to risks. Will review notes. Consider EGD plus/minus colon when INR <1.5. Will follow. Thanks.  Electronic Signatures: Lutricia Feilh, Nitesh Pitstick (MD)  (Signed on 05-Jul-13 19:40)  Authored  Last Updated: 05-Jul-13 19:40 by Lutricia Feilh, Steve Gregg (MD)

## 2015-01-02 NOTE — Consult Note (Signed)
Chief Complaint:   Subjective/Chief Complaint no acute complaints   VITAL SIGNS/ANCILLARY NOTES: **Vital Signs.:   04-Jul-13 05:48   Vital Signs Type Routine   Temperature Temperature (F) 98.5   Celsius 36.9   Temperature Source Oral   Pulse Pulse 72   Respirations Respirations 18   Systolic BP Systolic BP 128   Diastolic BP (mmHg) Diastolic BP (mmHg) 75   Mean BP 92   Pulse Ox % Pulse Ox % 94   Pulse Ox Activity Level  At rest   Oxygen Delivery Room Air/ 21 %   Brief Assessment:   Respiratory clear BS  no use of accessory muscles    Gastrointestinal details normal Nontender    Additional Physical Exam pallor alert and cooperative  no distress   Lab Results: General Ref:  03-Jul-13 04:45    Vitamin B12, Serum ========== TEST NAME ==========  ========= RESULTS =========  = REFERENCE RANGE =  VITAMIN B12  Vitamin B12 Vitamin B12                     [   304 pg/mL            ]           [id number]               [facility]            No: [id number]           [address], [place], [place] [id number]           Mila HomerWilliam F Hancock, MD         [phone]   Result(s) reported on 13 Mar 2012 at 04:48AM.  Routine Chem:  03-Jul-13 04:45    Result Comment WBC - RESULTS VERIFIED BY REPEAT TESTING.  - NOTIFIED OF CRITICAL VALUE  - CALLED TO KELLY ZIELKE@0533 ,03/12/12  - READ-BACK PROCESS PERFORMED.  - TPL  Result(s) reported on 12 Mar 2012 at 05:29AM.   Folic Acid, Serum 7.7 (Result(s) reported on 12 Mar 2012 at 03:13PM.)  04-Jul-13 05:08    Result Comment WBC - RESULTS VERIFIED BY REPEAT TESTING.  - CRITICAL VALUE PREVIOUSLY NOTIFIED.  - TPL CALLED 03/12/12 AT 0533.Marland Kitchen.Marland Kitchen.LA B  Result(s) reported on 13 Mar 2012 at 05:41AM.  Routine Sero:  03-Jul-13 18:54    Occult Blood, Feces POSITIVE (Result(s) reported on 12 Mar 2012 at 07:26PM.)  Routine Coag:  03-Jul-13 04:45    Prothrombin  18.9   INR 1.5 (INR reference interval applies to patients on anticoagulant therapy. A  single INR therapeutic range for coumarins is not optimal for all indications; however, the suggested range for most indications is 2.0 - 3.0. Exceptions to the INR Reference Range may include: Prosthetic heart valves, acute myocardial infarction, prevention of myocardial infarction, and combinations of aspirin and anticoagulant. The need for a higher or lower target INR must be assessed individually. Reference: The Pharmacology and Management of the Vitamin K  antagonists: the seventh ACCP Conference on Antithrombotic and Thrombolytic Therapy. Chest.2004 Sept:126 (3suppl): [id number]. A HCT value >55% may artifactually increase the PT.  In one study,  the increase was an average of 25%. Reference:  "Effect on Routine and Special Coagulation Testing Values of Citrate Anticoagulant Adjustment in Patients with High HCT Values." American Journal of Clinical Pathology 2006;126:400-405.)  04-Jul-13 05:08    Prothrombin  19.5   INR 1.6 (INR reference interval applies to patients on anticoagulant therapy. A single INR therapeutic range  for coumarins is not optimal for all indications; however, the suggested range for most indications is 2.0 - 3.0. Exceptions to the INR Reference Range may include: Prosthetic heart valves, acute myocardial infarction, prevention of myocardial infarction, and combinations of aspirin and anticoagulant. The need for a higher or lower target INR must be assessed individually. Reference: The Pharmacology and Management of the Vitamin K  antagonists: the seventh ACCP Conference on Antithrombotic and Thrombolytic Therapy. Chest.2004 Sept:126 (3suppl): [id number]. A HCT value >55% may artifactually increase the PT.  In one study,  the increase was an average of 25%. Reference:  "Effect on Routine and Special Coagulation Testing Values of Citrate Anticoagulant Adjustment in Patients with High HCT Values." American Journal of Clinical Pathology 2006;126:400-405.)   Routine Hem:  03-Jul-13 04:45    WBC (CBC)  1.9   RBC (CBC)  2.61   Hemoglobin (CBC)  7.3   Hematocrit (CBC)  22.2   Platelet Count (CBC) 161   MCV 85   MCH 27.8   MCHC 32.6   RDW  16.3   Neutrophil % 31.4   Lymphocyte % 47.1   Monocyte % 18.9   Eosinophil % 1.9   Basophil % 0.7   Neutrophil #  0.6   Lymphocyte #  0.9   Monocyte # 0.4   Eosinophil # 0.0   Basophil # 0.0  04-Jul-13 05:08    WBC (CBC)  2.0   RBC (CBC)  2.81   Hemoglobin (CBC)  7.9   Hematocrit (CBC)  23.9   Platelet Count (CBC) 172   MCV 85   MCH 28.1   MCHC 33.1   RDW  16.8   Neutrophil % 24.3   Lymphocyte % 55.9   Monocyte % 17.3   Eosinophil % 2.0   Basophil % 0.5   Neutrophil #  0.5   Lymphocyte # 1.1   Monocyte # 0.4   Eosinophil # 0.0   Basophil # 0.0   Assessment/Plan:  Assessment/Plan:   Assessment SEE ALSO INITIAL HEME CONSULT.  ANEMIA MULTIFACTORIAL, INFECTION, POSSIBLE GI BLOOD LOSS, AND CONCERN RE DEVELOPING AN MDS, OR UNDERLYING MYELOMA.  B12 , FOLATE, IRON LOOK OK. HAS RA  AND CAN GET CHRONIC DISEASE ANEMIA, WILL CHECK FOR EVIDENCE OF HEMOLYSIS.  NEUTROPENIA ALSO MULTIFACTORIAL, CONCERN RE MDS, AUTOIMMUNE NEUTROPENIA IS UNCOMMON, MAY HAVE BENIGN CYCLIC GIVEN HISTORTY OR RECENT UP AND DOWN HGB, BUT FAILURE TO RESPOND TO RECENT STRESS IS UNLIKE BENIGN CYCLIC. WATCH FOR BLEED, STOOLS ARE GUIAC POS    Plan IF TEMP SPIKE, CULTURE AND ADD ZOSYN AND ONE DOSE VANCOMYCIN INITIALLY, FOR EMPIRIC COVERAGE. CHECK COOMBS, HAPTOGLOBIN, RETICS, LDH.  CHECK WITH OUTSIDE HEMATOLOGIST TOMORROW RE PRIOR W/U, IF NOT DONE WOULD ADD HIV, HEP C, AND SIEP AND UIEP. PATIENT MAY NEED BM EXAM LATER. PATIENT PREFERS TO F/U AS OUT PATIENT WITH DR METTHA   Electronic Signatures: Marin Roberts (MD)  (Signed 04-Jul-13 13:31)  Authored: Chief Complaint, VITAL SIGNS/ANCILLARY NOTES, Brief Assessment, Lab Results, Assessment/Plan   Last Updated: 04-Jul-13 13:31 by Marin Roberts (MD)

## 2015-01-02 NOTE — Consult Note (Signed)
PATIENT NAME:  Tracey Hopkins, Tracey Hopkins MR#:  [id number] DATE OF BIRTH:  [birth year]  DATE OF CONSULTATION:  03/14/2012  REFERRING PHYSICIAN:  Dr. Cherylann Ratel  CONSULTING PHYSICIAN:  Rodman Key, NP / Dr. Lutricia Feil  REASON FOR CONSULTATION: Heme-positive stool and anemia.    HISTORY OF PRESENT ILLNESS: Ms. Lenig is an 81 year old Caucasian female who presented to [facility] for the concern of fatigue, vomiting, urinary tract infection which had just recently been diagnosed on an outpatient basis and finished antibiotic therapy a week ago but unfortunately her symptoms did not improve. She has a significant medical history of colon cancer diagnosed in 1999. She had emergent surgery done for perforation diverticula. According to patient at that time pathology was done which revealed evidence of adenocarcinoma. She had previously been under the care of Dr. Darleene Cleaver at [facility]. Her most recent colonoscopy was done in early 2000s. According to patient she has had colonic dilatation done numerous times for stricture and according to patient has been advised not to have a colonoscopy repeated any further.   Last Saturday onset of tightness to mid substernal/epigastric area. Then on Sunday evening became very nauseated, vomited. Vomiting up bile-like emesis. No hematemesis. She continues with discomfort epigastric area described as a dull pain, vomiting did not make it better or worse at that time. She has a standing history of constipation which she had not had a bowel movement for 3 to 4 days prior to receiving a suppository yesterday evening. Does note specks of bright red blood with cleansing on toilet paper at times. She has always felt that it is possibly related to hemorrhoids. She is chronically on iron. Notes her stool remains black and she states it is difficult to notice if it is truly intermixed within her stool. Known history of gastroesophageal reflux disease. Appetite has waxed  and waned. Since 2009 she has a significant weight loss of at least 60 pounds since the death of her husband.    HOME MEDICATIONS:  1. Colace 100 mg 3 tablets daily. 2. Coumadin 2.5 mg once a day except on Friday she takes 3 mg. 3. Meloxicam 15 mg daily.  4. Multivitamin once a day.  5. Vitamin B12 1000 mcg once a day. 6. Roxicodone 5 mg p.r.n.  7. Calcium with vitamin D once a day. 8. Trazodone at night. 9. Lyrica dose is unknown.   ALLERGIES: Penicillin, codeine and Keflex.   PAST MEDICAL HISTORY:  1. Osteoarthritis. 2. Chronic back pain.  3. History of deep vein thrombosis 1999. 4. History of pulmonary embolus early in 2000s. 5. History of colon cancer diagnosed 1999.   PAST SURGICAL HISTORY:  1. Back surgery twice 2007. 2. Left hip replacement. 3. History of intestinal rupture related to diverticulosis, had a colostomy which has since been reversed.  4. Eyelid surgery. 5. Multiple skin cancer excisions.  6. Left inguinal hernia repair. 7. Right knee surgery three for a staph 1999.  8. Multiple colonoscopies in the past, most recent early 2000s.  9. 1980s esophagus polyp removed.   FAMILY HISTORY: Father deceased from a stroke. Sister had a stroke. Mother suffered with a stroke and a heart attack. Father myocardial infarction. Father prostate cancer. No colon cancer. No history of colonic polyps.   SOCIAL HISTORY: No tobacco use. No alcohol. Widow. Lives with her daughter and her son.    REVIEW OF SYSTEMS: CONSTITUTIONAL: Denies any fevers. Significant for fatigue, chills. EYES: No blurred vision. No double vision.  ENT: No hearing impairment. No sore throat, dysphagia. CARDIOVASCULAR: No chest pain other than substernal correlation with epigastric discomfort. No syncope or near syncope episodes. RESPIRATORY: No coughing, no wheezing. GASTROINTESTINAL: See history of present illness. GENITOURINARY: Significant for dysuria, frequent urination. Diagnosed with a urinary tract  infection. MUSCULOSKELETAL: Known history of osteoarthritis. Significant for generalized arthralgias, myalgias. INTEGUMENTARY: No lesions. No rashes. NEUROLOGICAL: No history of cerebrovascular accident or transient ischemic attack. PSYCH: No depression. No anxiety. ENDOCRINE: No polyuria, polydipsia. Mild frequency with urination. No heat or cold intolerance.   PHYSICAL EXAMINATION:  VITAL SIGNS: Temperature 98, respirations 16, pulse 69, blood pressure 165/75.   GENERAL: Well-developed, slender 81 year old Caucasian female, no acute distress noted, pleasant, tolerating and eating her regular diet without any difficulty. Family members present.   HEENT: Normocephalic, atraumatic. Pupils equal, reactive to light. Conjunctivae clear. Sclerae anicteric.   NECK: Supple. Trachea midline. No lymphadenopathy.   PULMONARY: Symmetric rise and fall of chest. Clear to auscultation.   CARDIOVASCULAR: Regular rhythm, S1, S2. 2/6 systolic murmur.   ABDOMEN: Soft, nondistended. Bowel sounds in four quadrants. Marked discomfort noted to left lower quadrant as well as epigastric. No masses.   RECTAL: Deferred.   MUSCULOSKELETAL: Moving all four extremities. No contractures. No clubbing.   NEUROLOGIC: Alert and oriented x4. No gross neurological deficits.   EXTREMITIES: No edema.   LABORATORY, DIAGNOSTIC AND RADIOLOGICAL DATA: All laboratory studies as well as imaging studies were reviewed by myself. Specifically hemoglobin and hematocrit on 07/05: Hemoglobin 8.1 with hematocrit of 24.3. PT 25.0 with a PTT of 22.2. On 06/30 PT was 15.0 with an INR of 1.2. Abdominal ultrasound was done this morning which revealed evidence of cholelithiasis without evidence of acute cholecystitis. KUB on 07/03 reveals bowel gas pattern nonspecific, element of constipation, a few loops of mildly distended gas filled with small bowel.    IMPRESSION:  1. Anemia in the setting of heme-positive stool found during admission.   2. Personal history of colon cancer.  3. Long-standing history of constipation.  4. Abdominal pain, epigastric, left lower quadrant, findings of cholelithiasis.   PLAN: Patient's presentation was discussed with Dr. Lutricia FeilPaul Oh. Following recommendations made: Medical release form signed to [facility]oscopy reports to reveal evidence of what appears to be colonic stricture in the past with dilatation having been performed. Recommendation is to consider proceeding forward with a colonoscopy as well as an upper endoscopy. This will be decided by Dr. Lutricia FeilPaul Oh once patient has been seen and evaluated by him. At this time patient's anti-thrombolytic therapy has been resumed which will need to be held to goal of INR less than 1.5. Will continue to monitor hemodynamic status as well as laboratory results. Recommendation is to transfuse as necessary.   These services were provided by myself under collaborative agreement with Dr. Lutricia FeilPaul Oh.   ____________________________ Rodman Keyawn S. Marley Charlot, NP dsh:cms D: 03/14/2012 13:24:37 ET T: 03/14/2012 14:51:57 ET JOB#: [id number]  cc: Rodman Keyawn S. Donte Kary, NP, <Dictator>  Rodman KeyAWN S Tiannah Greenly MD ELECTRONICALLY SIGNED 03/14/2012 17:04

## 2015-01-02 NOTE — Consult Note (Signed)
Chief Complaint:   Subjective/Chief Complaint Feels pretty well. Hgb low but stable over last few days. INR now 2.8. [facility]. No abd pain now.   VITAL SIGNS/ANCILLARY NOTES: **Vital Signs.:   06-Jul-13 06:01   Vital Signs Type POCT   Nurse Fingerstick (mg/dL) FSBS (fasting range 16-1065-99 mg/dL) [id number]   Comments/Interventions  Nurse Notified   Brief Assessment:   Cardiac Regular    Respiratory clear BS    Gastrointestinal Normal   Lab Results: Routine Coag:  06-Jul-13 04:34    Prothrombin  29.8   INR 2.8 (INR reference interval applies to patients on anticoagulant therapy. A single INR therapeutic range for coumarins is not optimal for all indications; however, the suggested range for most indications is 2.0 - 3.0. Exceptions to the INR Reference Range may include: Prosthetic heart valves, acute myocardial infarction, prevention of myocardial infarction, and combinations of aspirin and anticoagulant. The need for a higher or lower target INR must be assessed individually. Reference: The Pharmacology and Management of the Vitamin K  antagonists: the seventh ACCP Conference on Antithrombotic and Thrombolytic Therapy. Chest.2004 Sept:126 (3suppl): [id number]. A HCT value >55% may artifactually increase the PT.  In one study,  the increase was an average of 25%. Reference:  "Effect on Routine and Special Coagulation Testing Values of Citrate Anticoagulant Adjustment in Patients with High HCT Values." American Journal of Clinical Pathology 2006;126:400-405.)   Assessment/Plan:  Assessment/Plan:   Assessment Heme positive anemia on coumadin.    Plan Needs records from [facility] to review. To proceed with EGD or colon, will need to hold coumadin  for min 4-5 days. Since patient is relatively stable now, then consider sending patient home soon with f/u with her primary doctors to evaluate her heme positive stool. Thanks.   Electronic Signatures: Lutricia Feilh, Cleotis Sparr (MD)   (Signed 06-Jul-13 09:25)  Authored: Chief Complaint, VITAL SIGNS/ANCILLARY NOTES, Brief Assessment, Lab Results, Assessment/Plan   Last Updated: 06-Jul-13 09:25 by Lutricia Feilh, Vimal Derego (MD)

## 2015-01-02 NOTE — Discharge Summary (Signed)
PATIENT NAME:  Tracey Hopkins, Tracey Hopkins MR#:  [id number] DATE OF BIRTH:  February 17, 1926  DATE OF ADMISSION:  03/10/2012 DATE OF DISCHARGE:  03/15/2012  PRIMARY CARE PHYSICIAN: Clayborn Bigness, MD  DISCHARGE DIAGNOSES:  1. Culture-negative urinary tract infection. 2. Dehydration. 3. Acute blood loss anemia along with iron deficiency and anemia of chronic disease. 4. Leukopenia. 5. Hyperglycemia without diabetes mellitus.  6. 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  7. Osteoarthritis and rheumatoid arthritis. 8. Cholelithiasis.  CONSULTANTS: Lutricia Feil, MD - Gastroenterology.  PROCEDURES PERFORMED: None.  IMAGING STUDIES: KUB x-ray showed nonspecific bowel gas pattern with no acute abnormalities.    Ultrasound of the abdomen showed cholelithiasis without acute cholecystitis.  ADMITTING HISTORY AND PHYSICAL: Please see detailed History and Physical dictated on 03/10/2012.    In brief, this is an 81 year old Caucasian female patient who developed fatigue, nausea and vomiting, and the patient was brought to the emergency room.  The patient was found to be too lethargic to be discharged home and was admitted for further management.  The patient was also found to be dehydrated at the time of admission along with urinary tract infection.   HOSPITAL COURSE: The patient was admitted onto the medical floor and started on IV antibiotics along with IV fluids with which she improved well. The patient's urinary tract infection is significantly improved at the time of discharge and will be continued on ciprofloxacin for four more days, but her urine cultures have been negative and dehydration has resolved.    The patient did develop anemia with her hemoglobin dropping to 8.1 from 10.5. This was likely secondary to dehydration, but with the patient being on Coumadin Hemoccult of the stool was checked which was positive after which Dr. Bluford Kaufmann of Gastroenterology  was consulted.  Initial consideration was given to colonoscopy, but after her hemoglobin had been stable and reluctance of the patient and her daughter to go ahead with colonoscopy, an outpatient colonoscopy was advised if her hemoglobin drops further after INR is less than 1.5.  The patient's Coumadin has been therapeutic during the hospital stay within 2 to 3 for which she is on treatment for history of PE and DVT.  The patient also had leukopenia with mild neutropenia for which hematology/oncology was consulted who suggested the patient have followup with her hematologist.    On the day of discharge, the patient is doing well with complaints of back pain and lower extremity pain which is chronic. No nausea or vomiting or melena. Hemoglobin has been in the stable range at 8.1. Vitals are in normal range. The case was discussed with the patient and her daughter and the patient is being discharged home to followup with her primary care physician and hematologist.   DISCHARGE MEDICATIONS: 1. Docusate sodium 100 mg oral three times daily. 2. Multivitamin one tablet oral once a day. 3. Roxicodone 5 mg one to two tablets every 4 hours as needed for pain. 4. Coumadin 3 mg oral on Friday and 2.5 mg daily, except Fridays. 5. Vitamin D citrate 630 mg once a day. 6. Vitamin B12 1000 mcg oral once a day. 7. Lyrica 75 mg orally twice day. 8. Trazodone 150 mg oral once a day at bedtime. 9. Prilosec 20 mg oral once a day. 10. Ferrous sulfate 325 mg oral two times daily with meals. 11. Ciprofloxacin 500 mg oral one tablet twice daily for four days.  DISCHARGE INSTRUCTIONS: Followup with Dr. Quillian Quince, primary care physician, in  less than one week. Also followup with hematologist in a week. The patient will get a CBC in 3 days, which will be followed up by Dr. Quillian QuinceBliss for any further drop in her hemoglobin. The patient has been advised to return to the emergency room if her symptoms worsen in any way.    This plan  was discussed with the patient and her daughter, Norman Clayamela Dixon, who are in agreement with the plan.   TIME SPENT: Time spent today in coordination of care and time spent with the patient and family was 42 minutes.  ____________________________ Molinda BailiffSrikar R. Bridgid Printz, MD srs:slb D: 03/15/2012 12:32:16 ET T: 03/17/2012 11:36:20 ET JOB#: [id number]  cc: Wardell HeathSrikar R. Elpidio AnisSudini, MD, <Dictator> Burley SaverL. Katherine Bliss, MD Orie FishermanSRIKAR R Lona Six MD ELECTRONICALLY SIGNED 03/21/2012 14:12

## 2015-01-02 NOTE — H&P (Signed)
PATIENT NAME:  Tracey Hopkins, Tracey Hopkins MR#:  [id number] DATE OF BIRTH:  [birth year]  DATE OF ADMISSION:  03/10/2012  PRIMARY CARE PHYSICIAN: She has no local doctor. She sees Dr. Joen Tracey Hopkins.  CHIEF COMPLAINT: Fatigue, vomiting, and urinary tract infection.   HISTORY OF PRESENT ILLNESS: Tracey Hopkins is an 81 year old pleasant Caucasian female who recently had a urinary tract infection treated as an outpatient and finished antibiotics about a week ago; however, her symptoms did not improve. She gradually developed fatigue and along with that she started to have vomiting. The patient was brought to the Emergency Department for evaluation and she was found to have a significant urinary tract infection. The patient is too lethargic to be discharged home, especially since she is symptomatic and she is having vomiting. She was admitted for further treatment along with IV hydration, antiemetics to control vomiting, and to have appropriate antibiotic for her urinary tract infection. The patient does not recall the name of the antibiotic that was used a week ago nor does she remember the doses of her  medications.     REVIEW OF SYSTEMS: CONSTITUTIONAL: Denies any fever, but reports a few chills. Reports fatigue as well. EYES: No blurring of vision. No double vision. ENT: No hearing impairment. No sore throat. No dysphagia. CARDIOVASCULAR: No chest pain. No shortness of breath. No syncope. RESPIRATORY: Reports no cough. However, today she has acid reflux symptoms and she feels she wants to cough whenever she has this feeling. No shortness of breath. No hemoptysis. GASTROINTESTINAL: Reports nausea and vomiting today. No abdominal pain other than the discomfort of the suprapubic area. GENITOURINARY: Reports dysuria and frequency of urination. MUSCULOSKELETAL: No joint swelling but she has chronic joint pains and back pain. No muscular swelling or tenderness. INTEGUMENT: No skin rash. No ulcers. NEUROLOGY: No focal weakness. No  seizure activity. No headache. PSYCH: No anxiety or depression. ENDOCRINE: No polyuria or polydipsia other than the mild frequency of urination. No heat or cold intolerance.   PAST MEDICAL HISTORY:  History of osteoarthritis, chronic back pain, history of multiple back surgeries complicated by deep vein thrombosis and twice she had pulmonary embolism. Since that time she has been maintained on chronic anticoagulation with Coumadin.    PAST SURGICAL HISTORY:  1. Back surgery times two. 2. Left hip replacement.  3. 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   FAMILY HISTORY: Her father died from stroke. She has also a sister who had a stroke, and her mother suffered from a stroke and heart attack.   SOCIAL HABITS: Nonsmoker. No history of alcohol abuse.   SOCIAL HISTORY: She is widowed. She lives at home with her daughter and her son.   ADMISSION MEDICATIONS:  1. Colace 100 mg 3 tablets a day.  2. Coumadin 2.5 mg once a day, except on Friday she takes 3 mg. The Coumadin has been on hold since Thursday as her INR was elevated, reaching 5.9.  3. Iron supplementation. 4. Meloxicam 15 mg a day.  5. Multivitamin once a day.  6. Vitamin B12 1000 mcg once a day. 7. Roxicodone 5 mg p.r.n. for pain.  8. Calcium and vitamin D once a day.  9. Trazodone at night. 10. Lyrica, dose is not specified.   ALLERGIES: Penicillin, codeine, and Keflex.   PHYSICAL EXAMINATION:  VITAL SIGNS: Blood pressure 154/85, respiratory 18, pulse 98, temperature 97.5, and oxygen saturation 96%.   GENERAL APPEARANCE: Elderly thin lady lying in bed in no acute distress.  HEAD AND NECK EXAMINATION: No pallor. No icterus. No cyanosis.   ENT: Hearing was normal. Nasal mucosa, lips, tongue were normal.   EYES: Normal iris and conjunctivae. Pupils about 3 to 4 mm, too small to detect reactivity to light.   NECK: Supple. Trachea at midline. No thyromegaly. No cervical lymphadenopathy. No  masses.   HEART: Normal S1, S2. No S3, S4. No murmur. No gallop. No carotid bruits.   RESPIRATORY: Normal breathing pattern without use of accessory muscles. No rales. No wheezing.   ABDOMEN: Soft without tenderness. No hepatosplenomegaly. No masses. No hernias.   SKIN: No ulcers. No subcutaneous nodules.   MUSCULOSKELETAL: No joint swelling. No clubbing.   NEUROLOGIC: Cranial nerves II through XII were intact. No focal motor deficit.   PSYCHIATRIC: The patient is alert and oriented times three. Mood and affect were flat.   LABORATORY, DIAGNOSTIC, AND RADIOLOGICAL DATA: EKG showed sinus rhythm at rate of 95 per minute, premature atrial complexes, incomplete right bundle branch block, otherwise unremarkable EKG except for low voltage. Blood work-up revealed glucose 161, BUN 26, creatinine 1.2, sodium 137, potassium 4.9. Liver function tests were normal except for slightly low albumin at 2.6. Troponin less than 0.02. CBC showed white count of 3000, hemoglobin 10, hematocrit 32, platelet count 224. Prothrombin time 15, INR 1.2. Urinalysis showed cloudy urine with 456 white blood cells and 277 red blood cells, +3 bacteria.   ASSESSMENT:  1. Generalized fatigue.  2. Vomiting.  3. Dehydration.  4. Urinary tract infection.  5. Chronic back pain.  6. Chronic anticoagulation with Coumadin with recent holding of Coumadin due to elevated INR. However, her INR now is low. Her other medical problems are as listed above.   PLAN: Admit the patient, start IV hydration with normal saline, Zofran p.r.n. to control nausea and vomiting. Start empiric antibiotic using ciprofloxacin pending results of the urine culture.  Resume the Coumadin; we will place her on 3 mg daily. Meanwhile, I started her on Lovenox 50 mg subcutaneous twice a day until her prothrombin time is therapeutic. I held most of her medications since they are not essential and we do not know the doses until the family brings her medications or  we contact her pharmacy in the morning. The patient indicates that she does have a LIVING WILL; however, she did not appoint anybody to have the power of attorney.   TIME SPENT EVALUATING THIS PATIENT: More than 55 minutes.     ____________________________ Carney Corners. Rudene Re, MD amd:bjt D: 03/10/2012 04:48:10 ET T: 03/10/2012 08:23:35 ET JOB#: [id number]  cc: Carney Corners. Rudene Re, MD, <Dictator> Joen Laura, MD Karolee Ohs Dala Dock MD ELECTRONICALLY SIGNED 03/10/2012 22:28

## 2015-02-21 IMAGING — CR DG KNEE 1-2V*L*
1 series · 2 of 2 positions shown · non-contrast
Comparison: none

REASON FOR EXAM: postop
COMMENTS:   Bedside (portable):Y

PROCEDURE:     DXR - DXR KNEE LEFT AP AND LATERAL  - December 15, 2012 [DATE]
RESULT:     Comparison: None.

[Series 1: ap · 0.17mm/px · 2 of 2 slices shown]
[im 1/2]
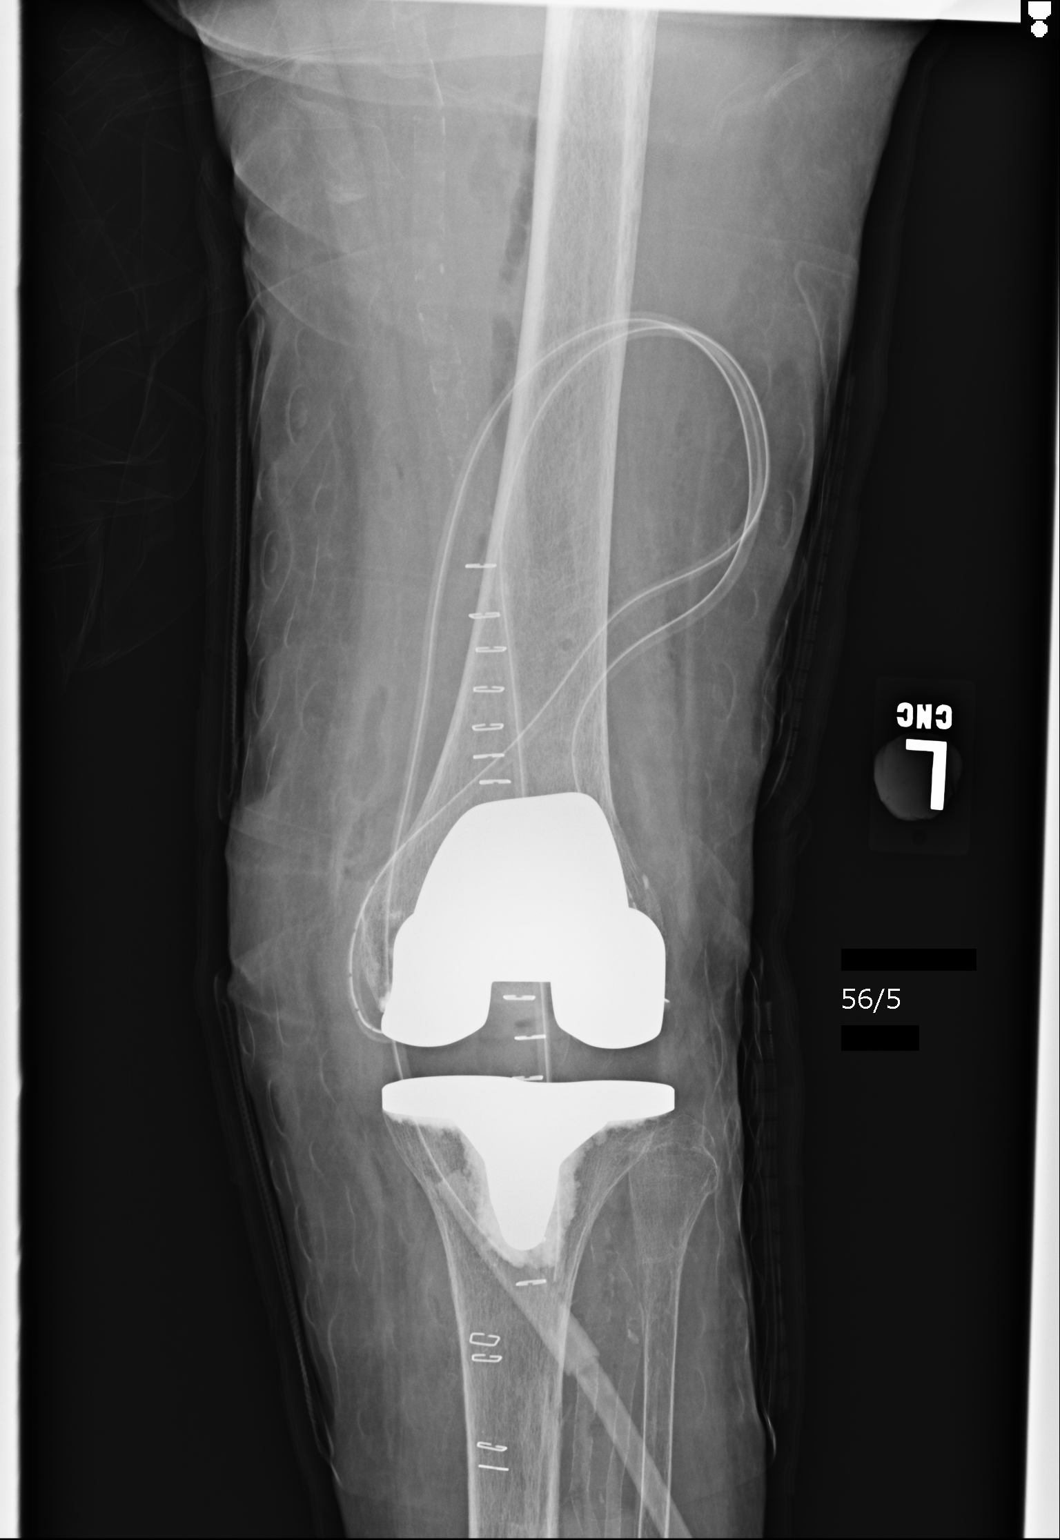
[im 2/2]
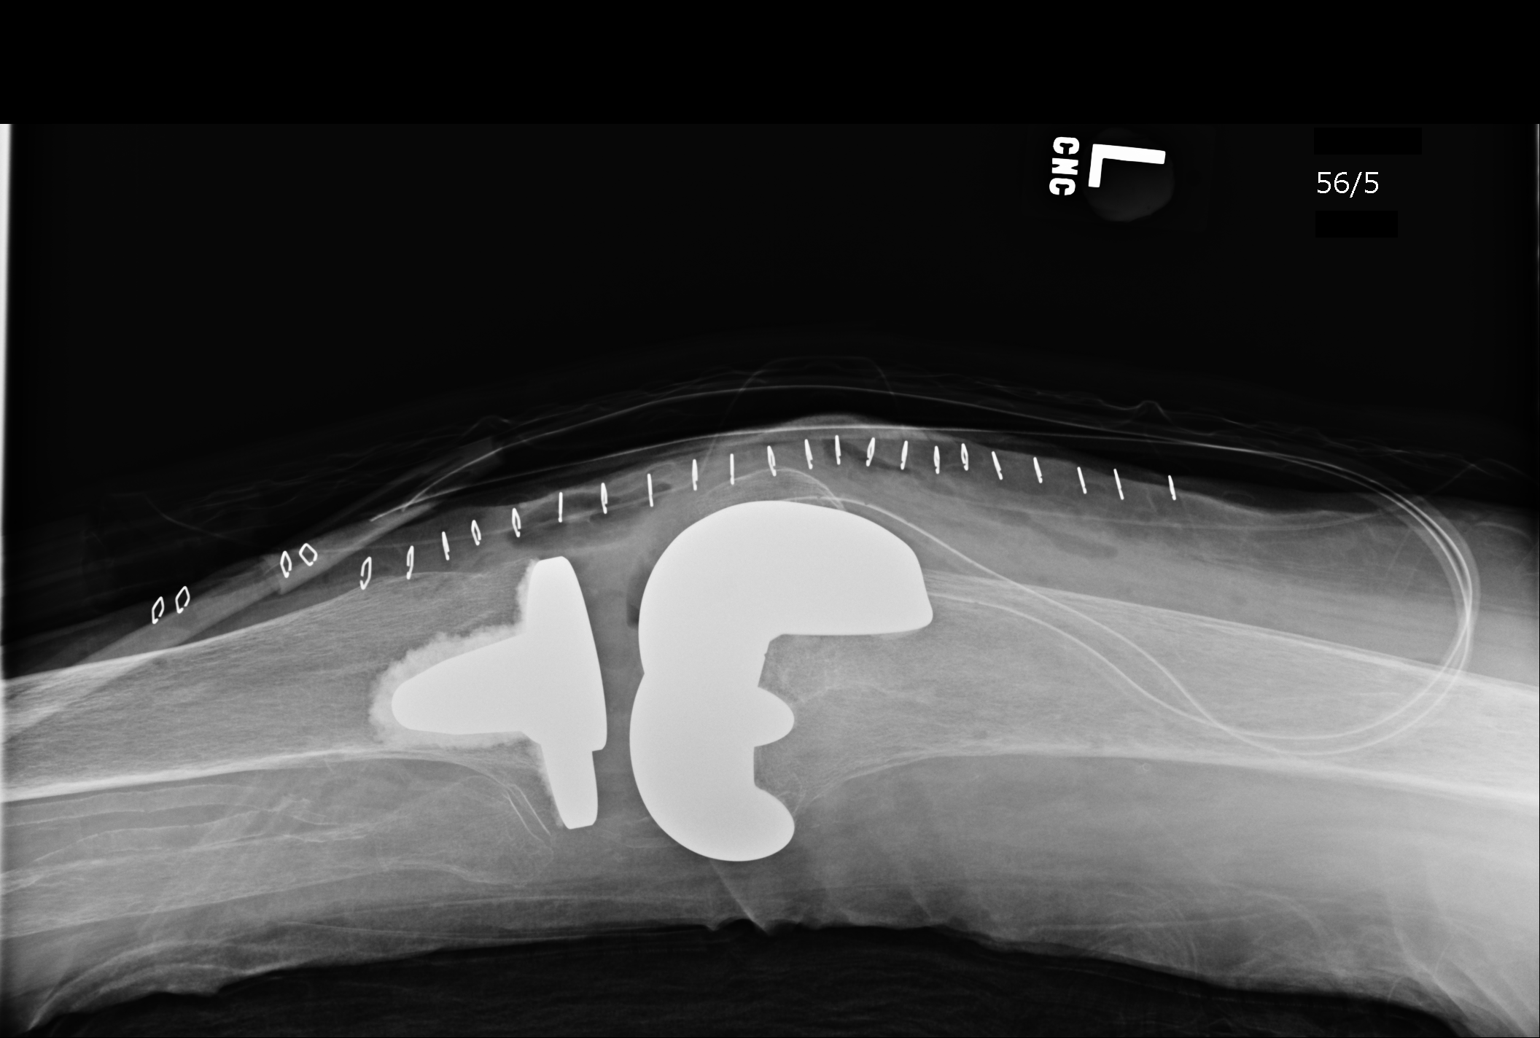

[2 of 2 positions shown; findings below may reference images not displayed]

FINDINGS: Hardware seen from total knee arthroplasty. Surgical drain and skin staples
are present. Soft tissue gas is likely postoperative.
IMPRESSION: Total knee arthroplasty.

[REDACTED]

## 2016-01-02 IMAGING — CT CT HEAD WITHOUT CONTRAST
1 of 2 series · 15 of 30 positions shown, 19 images · non-contrast
Comparison: CT HEAD W/O CM dated 06/01/2012

CLINICAL DATA: Slurred speech, seizures

EXAM:
CT HEAD WITHOUT CONTRAST
TECHNIQUE: Contiguous axial images were obtained from the base of the skull
through the vertex without intravenous contrast.

[Series 2: head wo · axial · 0.46mm/px · z∈[-131,-5]mm · 15 of 32 slices shown, 19 images]
[im 2/32  brain]
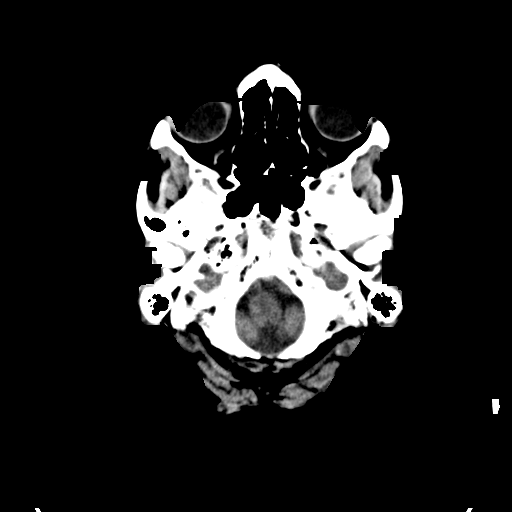
[im 2/32  bone]
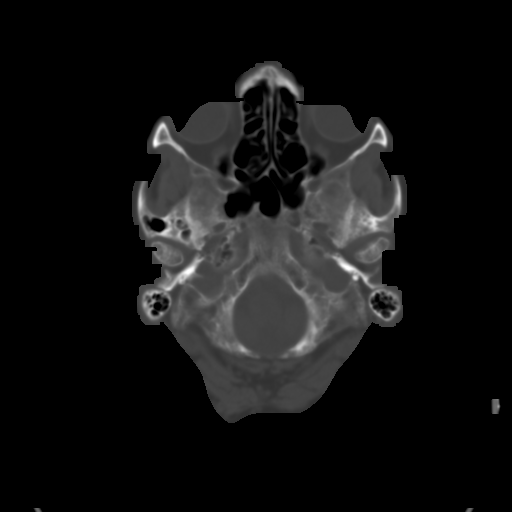
[im 4/32  brain]
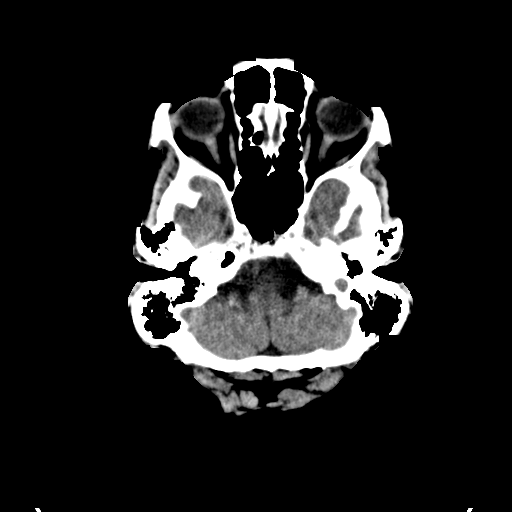
[im 6/32  brain]
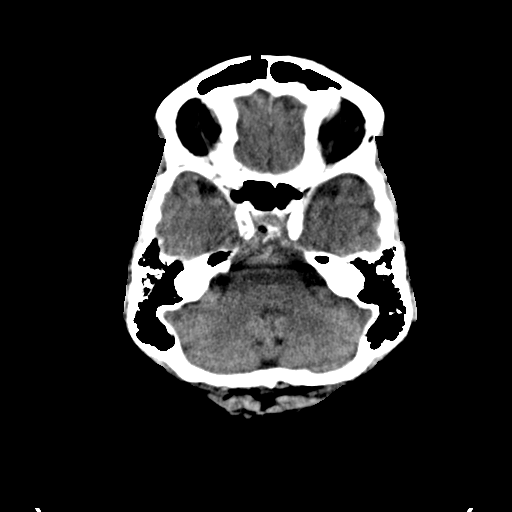
[im 8/32  brain]
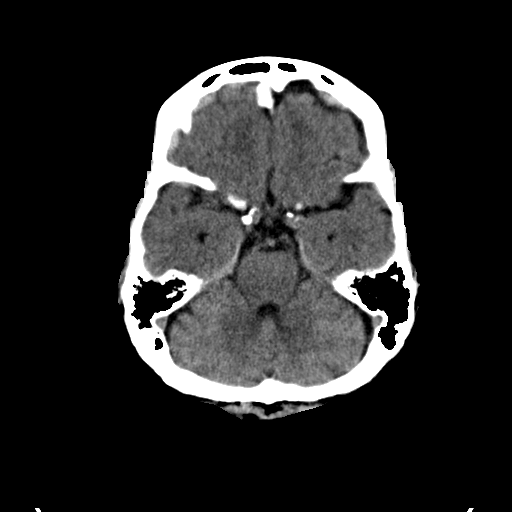
[im 10/32  brain]
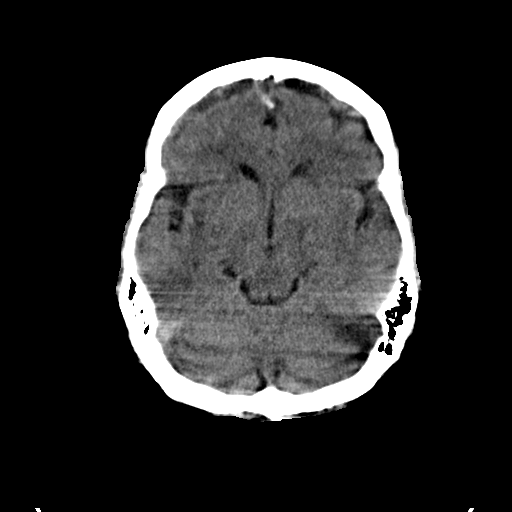
[im 10/32  bone]
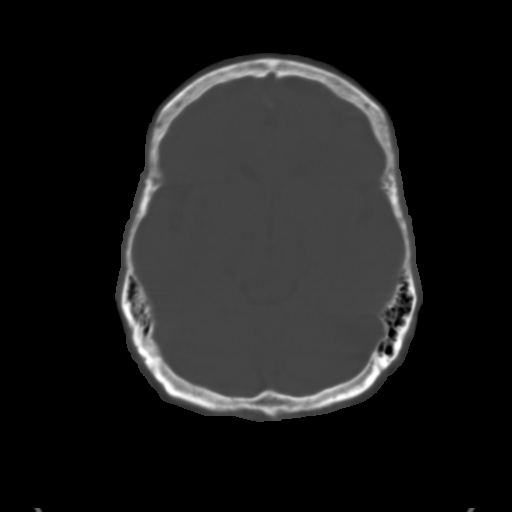
[im 12/32  brain]
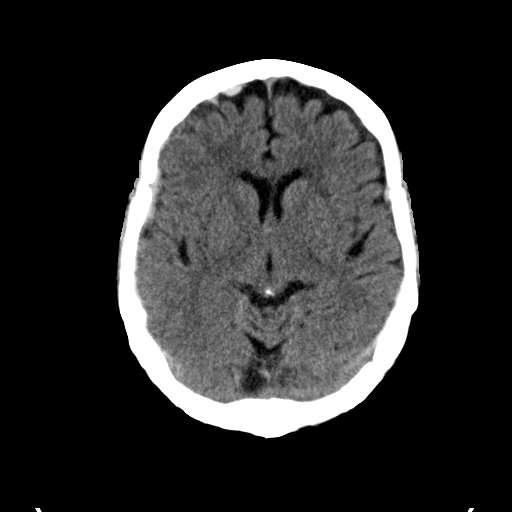
[im 14/32  brain]
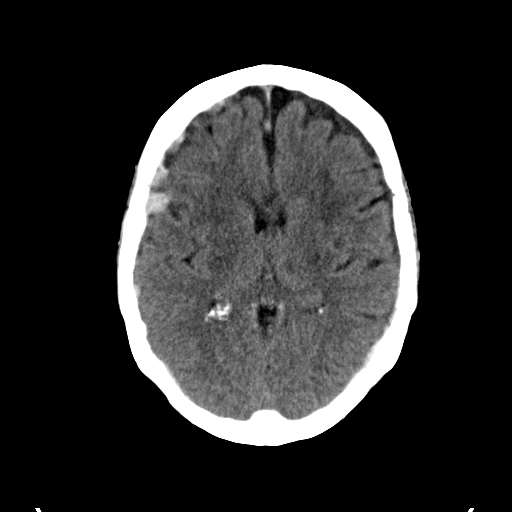
[im 16/32  brain]
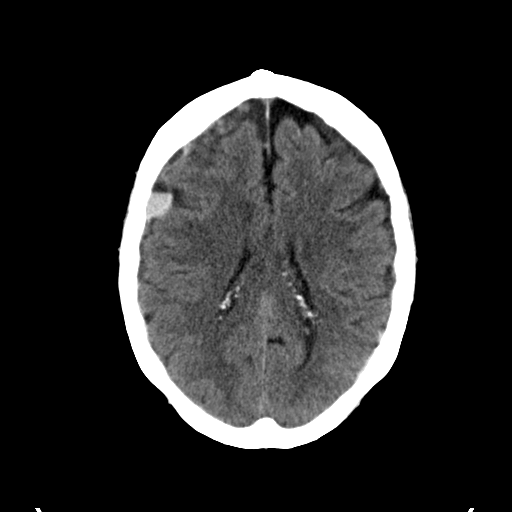
[im 18/32  brain]
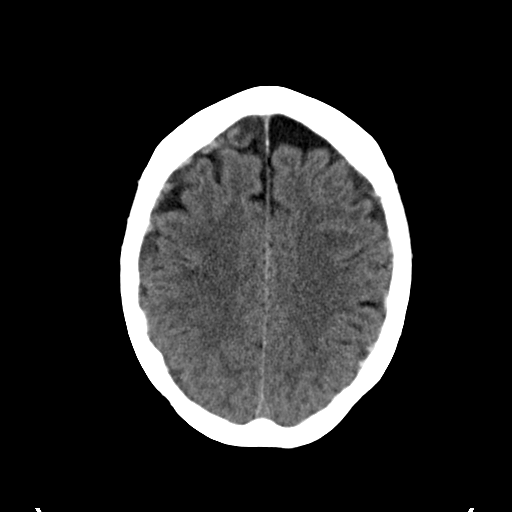
[im 18/32  bone]
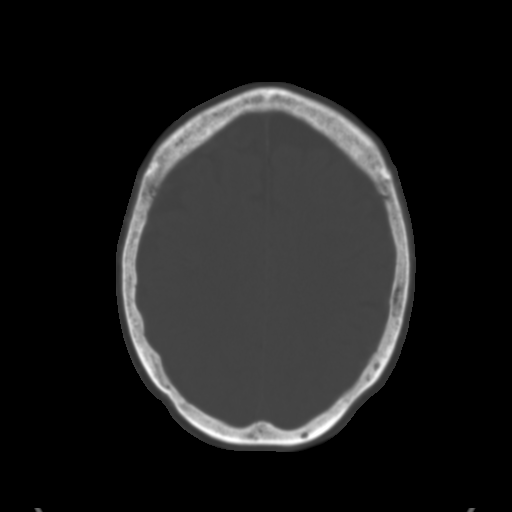
[im 20/32  brain]
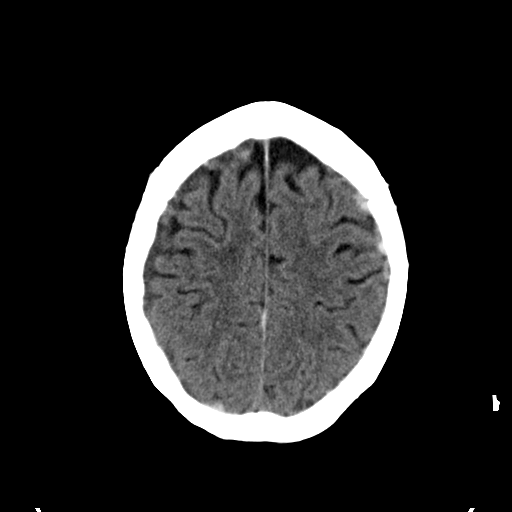
[im 22/32  brain]
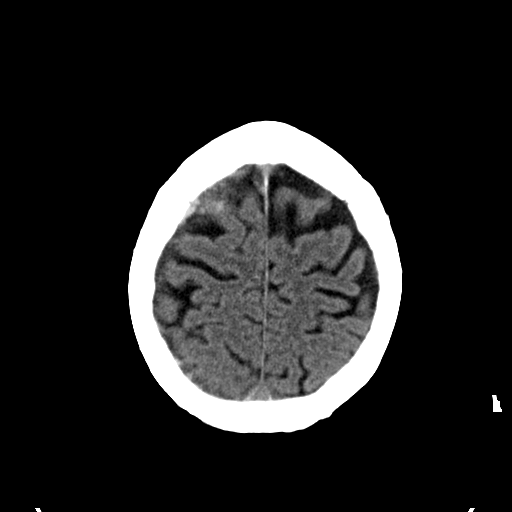
[im 24/32  brain]
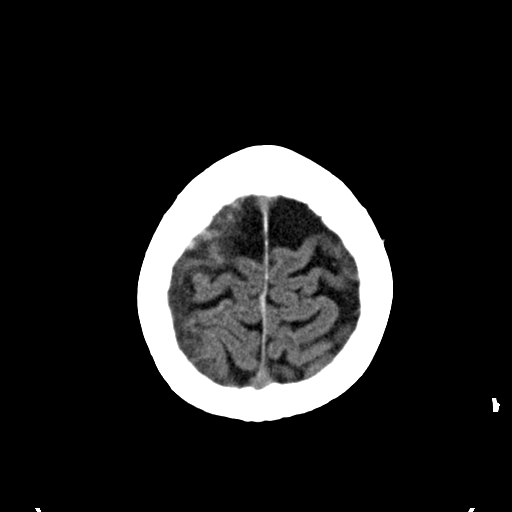
[im 26/32  brain]
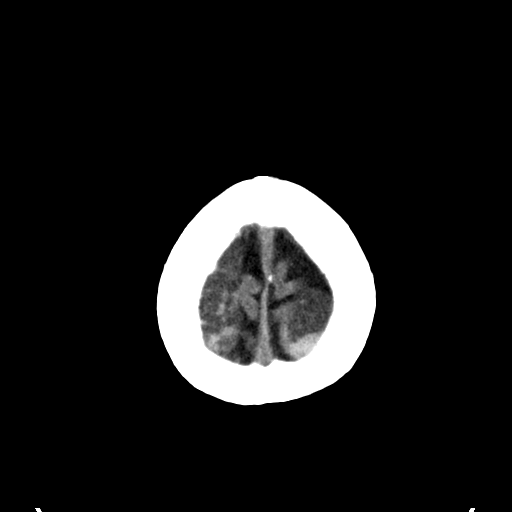
[im 26/32  bone]
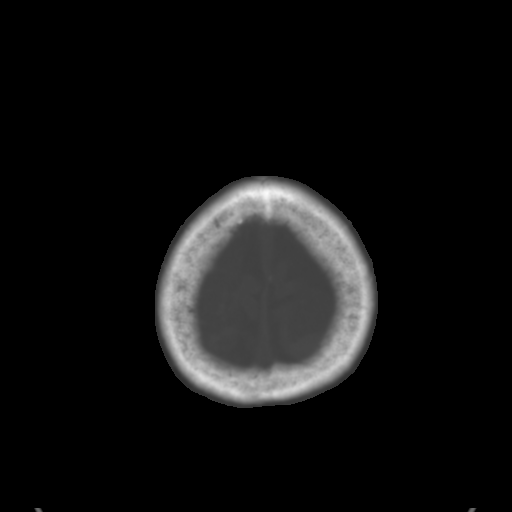
[im 28/32  brain]
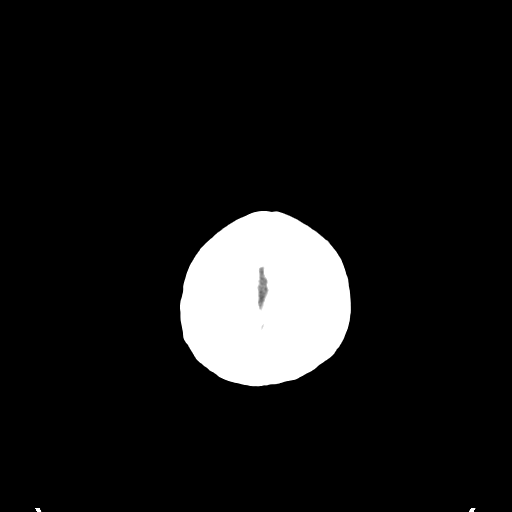
[im 30/32  brain]
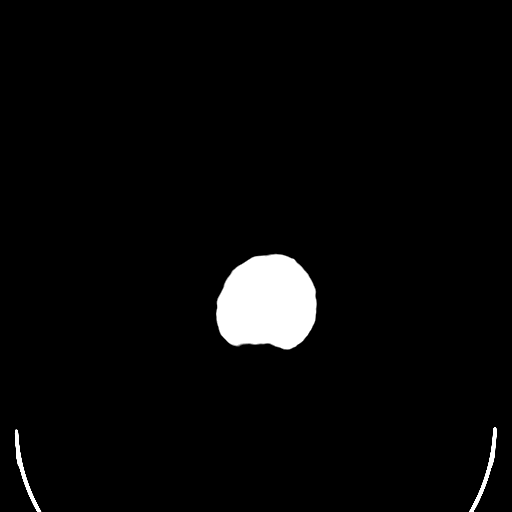

[15 of 30 positions shown; findings below may reference images not displayed]

FINDINGS: There is a high-density extra-axial fluid collection along the left
and right frontal lobes anteriorly consistent with a subdural
hematomas. No significant midline shift or mass effect. No evidence
of parenchymal contusion. The right subdural hematoma measures 9 mm
in maximal thickness while the left measures 5 mm in maximal
thickness.

Extra-axial hemorrhage is also seen superiorly over the left and
right highest frontal lobes (image 26, series 2. There is small
amount of subdural hematoma along the interhemispheric fissure on
the right (image 25, series 2.

No evidence of extension into the ventricles. No midline shift or
mass effect. Basilar cisterns are patent. No evidence of skull
fracture.

There is no evidence of skull fracture.  Orbits are normal.
IMPRESSION: 1. Bilateral subdural hematomas along the left and right lateral
cerebral convexities and extending over the high frontal lobes.
Subdural hematoma greater on the right.
2. Small amount of subdural hematoma along the right aspect of the
interhemispheric fissure.
3. No significant mass effect.
4. Cortical atrophy
